# Patient Record
Sex: Male | Born: 1995 | Race: White | Hispanic: No | Marital: Married | Smoking: Never smoker
Health system: Southern US, Community
[De-identification: ages and names within clinical notes are randomized; demographics above are authoritative.]

## PROBLEM LIST (undated history)

## (undated) DIAGNOSIS — Z789 Other specified health status: Secondary | ICD-10-CM

## (undated) DIAGNOSIS — F32A Depression, unspecified: Secondary | ICD-10-CM

## (undated) DIAGNOSIS — F419 Anxiety disorder, unspecified: Secondary | ICD-10-CM

## (undated) DIAGNOSIS — F329 Major depressive disorder, single episode, unspecified: Secondary | ICD-10-CM

---
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
## 2017-12-17 ENCOUNTER — Emergency Department (HOSPITAL_COMMUNITY)
Admission: EM | Admit: 2017-12-17 | Discharge: 2017-12-18 | Disposition: A | Discharge status: Home / Self Care | Payer: Medicaid Other | Attending: Emergency Medicine | Admitting: Emergency Medicine

## 2017-12-17 DIAGNOSIS — K0889 Other specified disorders of teeth and supporting structures: Secondary | ICD-10-CM | POA: Diagnosis not present

## 2017-12-18 ENCOUNTER — Encounter (HOSPITAL_COMMUNITY): Payer: Self-pay

## 2017-12-18 MED ORDER — HYDROCODONE-ACETAMINOPHEN 5-325 MG PO TABS
2.0000 | ORAL_TABLET | Freq: Once | ORAL | Status: AC
  Administered 2017-12-18: 2 via ORAL
  Filled 2017-12-18: qty 2

## 2017-12-18 MED ORDER — PENICILLIN V POTASSIUM 250 MG PO TABS
500.0000 mg | ORAL_TABLET | Freq: Once | ORAL | Status: AC
  Administered 2017-12-18: 500 mg via ORAL
  Filled 2017-12-18: qty 2

## 2017-12-18 MED ORDER — PENICILLIN V POTASSIUM 500 MG PO TABS: 500.0000 mg | ORAL_TABLET | Freq: Four times a day (QID) | ORAL | 0 refills | Status: AC

## 2017-12-18 MED ORDER — NAPROXEN 500 MG PO TABS: 500.0000 mg | ORAL_TABLET | Freq: Two times a day (BID) | ORAL | 0 refills | Status: DC

## 2017-12-18 NOTE — Discharge Instructions (Signed)
Take antibiotics as directed. Please take all of your antibiotics until finished. ° °Take Naprosyn as directed.   ° °The exam and treatment you received today has been provided on an emergency basis only. This is not a substitute for complete medical or dental care. If your problem worsens or new symptoms (problems) appear, and you are unable to arrange prompt follow-up care with your dentist, call or return to this location. If you do not have a dentist, please follow-up with one on the list provided ° °CALL YOUR DENTIST OR RETURN IMMEDIATELY IF you develop a fever, rash, difficulty breathing or swallowing, neck or facial swelling, or other potentially serious concerns. ° ° °Please follow-up with one of the dental clinics provided to you below or in your paperwork. Call and tell them you were seen in the Emergency Dept and arrange for an appointment. You may have to call multiple places in order to find a place to be seen. ° °Dental Assistance °If the dentist on-call cannot see you, please use the resources below: ° ° °Patients with Medicaid: [facility] °[address], [phone] °[address], [phone] ° °If unable to pay, or uninsured, contact [facility] ([phone]) or [facility] ([phone] in [place], [phone] in [place]) to become qualified for the adult dental clinic ° °Other Low-Cost Community Dental Services: °[facility]- [address], [place], [place], [address] °   [phone], Ext. [phone] °   2nd and 4th Thursday of the month at 6:30am °   10 clients each day by appointment, can sometimes see walk-in     patients if someone does not show for an appointment °[facility]- [address], [place], [place], [address] °   [phone] °[facility]- [address], [place], [place], [address] °   [phone] ° °[facility]- [phone] °[facility]- [phone] °[facility]- [phone] ° °

## 2017-12-18 NOTE — ED Provider Notes (Signed)
[name] Provider Note   CSN: [id number] Arrival date & time: 12/17/17  2351     History   Chief Complaint Chief Complaint  Patient presents with  . Dental Pain    HPI Peter Pacheco is a 22 y.o. male who presents for evaluation of chronic dental pain that is been ongoing for last several months.  Patient reports that he has been seen by a dentist and states that he was told he needed to have a root canal.  Patient reports that since then he has had chronic pain to the right lower molar.  Patient states that he is not able to get an appointment until August 1 to get be fixed.  He has been seen by his dentist proximally 1 month ago and was given ibuprofen.  Patient reports that over the last few weeks, the pain has gotten more severe and more constant.  He also reports that the pain is started going up to his right ear.  He states that particularly pain is worse when eating.  He states he has been taking ibuprofen intermittently with minimal improvement in symptoms.  Patient states that he has been able to tolerate his secretions and p.o. without any difficulty.  Patient denies any fever, facial swelling, tongue or lip swelling, difficulty breathing, vomiting.  The history is provided by the patient.    History reviewed. No pertinent past medical history.  There are no active problems to display for this patient.   History reviewed. No pertinent surgical history.      Home Medications    Prior to Admission medications   Medication Sig Start Date End Date Taking? Authorizing Provider  amoxicillin (AMOXIL) 500 MG capsule Take 1 capsule (500 mg total) by mouth 3 (three) times daily. 08/04/15   Devoria AlbeKnapp, Iva, MD  naproxen (NAPROSYN) 500 MG tablet Take 1 tablet (500 mg total) by mouth 2 (two) times daily. 12/18/17   Graciella FreerLayden, Shawanna Zanders A, PA-C  penicillin v potassium (VEETID) 500 MG tablet Take 1 tablet (500 mg total) by mouth 4 (four) times daily for 7 days.  12/18/17 12/25/17  Graciella FreerLayden, Shian Goodnow A, PA-C  Pseudoephedrine-APAP-DM (DAYQUIL PO) Take by mouth.    [provider]    Family History No family history on file.  Social History Social History   Tobacco Use  . Smoking status: Never Smoker  . Smokeless tobacco: Never Used  Substance Use Topics  . Alcohol use: No  . Drug use: No     Allergies   Patient has no known allergies.   Review of Systems Review of Systems  Constitutional: Negative for fever.  HENT: Positive for dental problem and ear pain. Negative for drooling, facial swelling and trouble swallowing.   Respiratory: Negative for shortness of breath.   Gastrointestinal: Negative for vomiting.     Physical Exam Updated Vital Signs BP 137/73 (BP Location: Left Arm)   Pulse 60   Temp 98 F (36.7 C) (Oral)   Resp 15   Ht 5\' 11"  (1.803 m)   Wt 90.7 kg (200 lb)   SpO2 99%   BMI 27.89 kg/m   Physical Exam  Constitutional: He appears well-developed and well-nourished.  HENT:  Head: Normocephalic and atraumatic.  Mouth/Throat:    Airways patent, phonation is normal.  No evidence of oral angioedema.  Face is symmetric in appearance without any overlying warmth, erythema, edema.  Eyes: Conjunctivae and EOM are normal. Right eye exhibits no discharge. Left eye exhibits no discharge.  No scleral icterus.  Pulmonary/Chest: Effort normal.  Neurological: He is alert.  Skin: Skin is warm and dry.  Psychiatric: He has a normal mood and affect. His speech is normal and behavior is normal.  Nursing note and vitals reviewed.    ED Treatments / Results  Labs (all labs ordered are listed, but only abnormal results are displayed) Labs Reviewed - No data to display  EKG None  Radiology No results found.  Procedures Procedures (including critical care time)  Medications Ordered in ED Medications  HYDROcodone-acetaminophen (NORCO/VICODIN) 5-325 MG per tablet 2 tablet (has no administration in time range)    penicillin v potassium (VEETID) tablet 500 mg (has no administration in time range)     Initial Impression / Assessment and Plan / ED Course  I have reviewed the triage vital signs and the nursing notes.  Pertinent labs & imaging results that were available during my care of the patient were reviewed by me and considered in my medical decision making (see chart for details).     22 y.o. M presents with months of dental pain.  States seen by dentist and told he was needed to have a root canal.  Reports pain has become more constant svere and it started radiating up to his ear. Patient is afebrile, non-toxic appearing, sitting comfortably on examination table. Vital signs reviewed and stable.  No evidence of abscess requiring immediate incision and drainage. Exam not concerning for Ludwig's angina or pharyngeal abscess. Will treat with penicillin as patient with no known drug allergy. Patient instructed to follow-up with dentist referral provided. Stable for discharge at this time. Strict return precautions discussed. Patient expresses understanding and agreement to plan.  Final Clinical Impressions(s) / ED Diagnoses   Final diagnoses:  Pain, dental    ED Discharge Orders        Ordered    naproxen (NAPROSYN) 500 MG tablet  2 times daily     12/18/17 0025    penicillin v potassium (VEETID) 500 MG tablet  4 times daily     12/18/17 0025       Maxwell Caul, PA-C 12/18/17 0035    Gilda Crease, MD 12/18/17 [phone]

## 2017-12-18 NOTE — ED Triage Notes (Signed)
Pt states recently told he needed a root canal on a left lower tooth, states pain worsened recently, taking ibuprofen without relief.

## 2018-01-11 ENCOUNTER — Encounter (HOSPITAL_COMMUNITY): Payer: Self-pay

## 2018-01-11 ENCOUNTER — Emergency Department (HOSPITAL_COMMUNITY)
Admission: EM | Admit: 2018-01-11 | Discharge: 2018-01-11 | Disposition: A | Discharge status: Home / Self Care | Payer: Medicaid Other | Attending: Emergency Medicine | Admitting: Emergency Medicine

## 2018-01-11 DIAGNOSIS — K0889 Other specified disorders of teeth and supporting structures: Secondary | ICD-10-CM

## 2018-01-11 DIAGNOSIS — Z79899 Other long term (current) drug therapy: Secondary | ICD-10-CM | POA: Insufficient documentation

## 2018-01-11 DIAGNOSIS — K029 Dental caries, unspecified: Secondary | ICD-10-CM | POA: Diagnosis not present

## 2018-01-11 MED ORDER — ONDANSETRON HCL 4 MG PO TABS
4.0000 mg | ORAL_TABLET | Freq: Once | ORAL | Status: AC
  Administered 2018-01-11: 4 mg via ORAL
  Filled 2018-01-11: qty 1

## 2018-01-11 MED ORDER — KETOROLAC TROMETHAMINE 10 MG PO TABS
10.0000 mg | ORAL_TABLET | Freq: Once | ORAL | Status: AC
  Administered 2018-01-11: 10 mg via ORAL
  Filled 2018-01-11: qty 1

## 2018-01-11 MED ORDER — TRAMADOL HCL 50 MG PO TABS: ORAL_TABLET | ORAL | 0 refills | Status: DC

## 2018-01-11 MED ORDER — DICLOFENAC SODIUM 75 MG PO TBEC: 75.0000 mg | DELAYED_RELEASE_TABLET | Freq: Two times a day (BID) | ORAL | 0 refills | Status: DC

## 2018-01-11 MED ORDER — PENICILLIN V POTASSIUM 500 MG PO TABS: 500.0000 mg | ORAL_TABLET | Freq: Four times a day (QID) | ORAL | 0 refills | Status: AC

## 2018-01-11 MED ORDER — AMOXICILLIN 250 MG PO CAPS
500.0000 mg | ORAL_CAPSULE | Freq: Once | ORAL | Status: AC
  Administered 2018-01-11: 500 mg via ORAL
  Filled 2018-01-11: qty 2

## 2018-01-11 MED ORDER — TRAMADOL HCL 50 MG PO TABS
100.0000 mg | ORAL_TABLET | Freq: Once | ORAL | Status: AC
  Administered 2018-01-11: 100 mg via ORAL
  Filled 2018-01-11: qty 2

## 2018-01-11 NOTE — Discharge Instructions (Addendum)
Your vital signs are within normal limits.  Your examination reveals an infected tooth on the left side.  There is no evidence that the infection has left the dental area.  Please use penicillin for times daily, please use diclofenac 2 times daily with a meal.  May use Ultram every 6 hours if needed for more severe pain. This medication may cause drowsiness. Please do not drink, drive, or participate in activity that requires concentration while taking this medication.  Please see Dr. Conni ElliotLaw for any additional pain management.  Please see your dentist as soon as possible concerning your increasing pain.

## 2018-01-11 NOTE — ED Provider Notes (Signed)
[facility]   CSN: [id number] Arrival date & time: 01/11/18  1826     History   Chief Complaint Chief Complaint  Patient presents with  . Dental Pain    HPI Peter Pacheco is a 22 y.o. male.  Patient is a 22 year old male who presents to the emergency department with a complaint of left lower toothache.  The patient states he has been having problems with this tooth on and on for a few months now.  He has been seen by his dentist, and has an appointment for the root canal on August 24 of 2019.  He had been prescribed penicillin and naproxen.  He says that this is no longer helping his pain at all.  His pain is now beginning to interfere with his work and with his rest at home.  He has not had any fever or chills.  He has not had any unusual swelling of his face or his mouth.  He is able to swallow without problem and he has no interference with his breathing.  The history is provided by the patient.  Dental Pain      History reviewed. No pertinent past medical history.  There are no active problems to display for this patient.   History reviewed. No pertinent surgical history.      Home Medications    Prior to Admission medications   Medication Sig Start Date End Date Taking? Authorizing Provider  amoxicillin (AMOXIL) 500 MG capsule Take 1 capsule (500 mg total) by mouth 3 (three) times daily. 08/04/15   Devoria AlbeKnapp, Iva, MD  naproxen (NAPROSYN) 500 MG tablet Take 1 tablet (500 mg total) by mouth 2 (two) times daily. 12/18/17   Graciella FreerLayden, Lindsey A, PA-C  Pseudoephedrine-APAP-DM (DAYQUIL PO) Take by mouth.    [provider]    Family History No family history on file.  Social History Social History   Tobacco Use  . Smoking status: Never Smoker  . Smokeless tobacco: Never Used  Substance Use Topics  . Alcohol use: No  . Drug use: No     Allergies   Patient has no known allergies.   Review of Systems Review of  Systems  Constitutional: Negative for activity change.       All ROS Neg except as noted in HPI  HENT: Positive for dental problem. Negative for nosebleeds.   Eyes: Negative for photophobia and discharge.  Respiratory: Negative for cough, shortness of breath and wheezing.   Cardiovascular: Negative for chest pain and palpitations.  Gastrointestinal: Negative for abdominal pain and blood in stool.  Genitourinary: Negative for dysuria, frequency and hematuria.  Musculoskeletal: Negative for arthralgias, back pain and neck pain.  Skin: Negative.   Neurological: Negative for dizziness, seizures and speech difficulty.  Psychiatric/Behavioral: Negative for confusion and hallucinations.     Physical Exam Updated Vital Signs BP 117/63 (BP Location: Right Arm)   Pulse 66   Temp 98.2 F (36.8 C) (Oral)   Resp 16   Ht 5\' 11"  (1.803 m)   Wt 90.7 kg   SpO2 99%   BMI 27.89 kg/m   Physical Exam  Constitutional: He is oriented to person, place, and time. He appears well-developed and well-nourished.  Non-toxic appearance.  HENT:  Head: Normocephalic.  Right Ear: Tympanic membrane and external ear normal.  Left Ear: Tympanic membrane and external ear normal.  Mouth/Throat: Uvula is midline.    There is a deep cavity of the left lower first molar.  There is swelling of the gum in this area.  No visible abscess.  The airway is patent.  The uvula is in the midline.  There is no swelling under the tongue.  There is no facial asymmetry.  There is no temperature changes about the face.  Eyes: Pupils are equal, round, and reactive to light. EOM and lids are normal.  Neck: Normal range of motion. Neck supple. Carotid bruit is not present.  Cardiovascular: Normal rate, regular rhythm, S1 normal, S2 normal, normal heart sounds, intact distal pulses and normal pulses.  No murmur heard. Pulmonary/Chest: Breath sounds normal. No respiratory distress.  Abdominal: Soft. Bowel sounds are normal. There  is no tenderness. There is no guarding.  Musculoskeletal: Normal range of motion.  Lymphadenopathy:       Head (right side): No submandibular adenopathy present.       Head (left side): No submandibular adenopathy present.    He has no cervical adenopathy.  Neurological: He is alert and oriented to person, place, and time. He has normal strength. No cranial nerve deficit or sensory deficit.  Skin: Skin is warm and dry.  Psychiatric: He has a normal mood and affect. His speech is normal.  Nursing note and vitals reviewed.    ED Treatments / Results  Labs (all labs ordered are listed, but only abnormal results are displayed) Labs Reviewed - No data to display  EKG None  Radiology No results found.  Procedures Procedures (including critical care time)  Medications Ordered in ED Medications  amoxicillin (AMOXIL) capsule 500 mg (has no administration in time range)  ketorolac (TORADOL) tablet 10 mg (has no administration in time range)  traMADol (ULTRAM) tablet 100 mg (has no administration in time range)  ondansetron (ZOFRAN) tablet 4 mg (has no administration in time range)     Initial Impression / Assessment and Plan / ED Course  I have reviewed the triage vital signs and the nursing notes.  Pertinent labs & imaging results that were available during my care of the patient were reviewed by me and considered in my medical decision making (see chart for details).       Final Clinical Impressions(s) / ED Diagnoses MDM  Vital signs within normal limits.  Pulse oximetry is 99% on room air.  Within normal limits by my interpretation.  Patient has a dental carry on the left lower molar area.  The current medication is no longer controlling his pain.  There is no evidence for Ludewig's angina or other emergent changes.  The patient will be prescribed antibiotics.  We will change his anti-inflammatory medication to assist with his pain.  The patient will be given 10 tablets of  Ultram to use for more severe pain.  The patient is advised to see Dr. Conni Elliot if any additional pain assistance is needed.  Patient is also encouraged to inquire if his dentist can see him any earlier since he is having pain that is interfering with his work as well as with his activities of daily living.   Final diagnoses:  Dental caries  Pain, dental    ED Discharge Orders         Ordered    penicillin v potassium (VEETID) 500 MG tablet  4 times daily     01/11/18 1922    diclofenac (VOLTAREN) 75 MG EC tablet  2 times daily     01/11/18 1923    traMADol (ULTRAM) 50 MG tablet     01/11/18 1923  Ivery Quale, PA-C 01/11/18 1934    Donnetta Hutching, MD 01/14/18 [phone]

## 2018-01-11 NOTE — ED Notes (Signed)
Pt seen here several weeks ago for dental pain  Reports was given antibiotics and has an appt for root canal the 20 something of this month  Here for pain

## 2018-01-11 NOTE — ED Triage Notes (Signed)
Pt seen 12/17/17 and has appt on 01/24/18 for root canal. Pt reports that he took meds and tooth was better, Now left lower tooth has been hurting for 3 days

## 2018-04-06 DIAGNOSIS — F4320 Adjustment disorder, unspecified: Secondary | ICD-10-CM | POA: Diagnosis not present

## 2018-06-10 DIAGNOSIS — G4709 Other insomnia: Secondary | ICD-10-CM | POA: Diagnosis not present

## 2019-04-23 ENCOUNTER — Encounter (HOSPITAL_COMMUNITY): Payer: Self-pay

## 2019-04-23 ENCOUNTER — Other Ambulatory Visit: Payer: Self-pay

## 2019-04-23 ENCOUNTER — Emergency Department (HOSPITAL_COMMUNITY)
Admission: EM | Admit: 2019-04-23 | Discharge: 2019-04-24 | Disposition: A | Discharge status: Other Acute Inpatient Hospital | Payer: Medicaid Other | Attending: Emergency Medicine | Admitting: Emergency Medicine

## 2019-04-23 DIAGNOSIS — R45851 Suicidal ideations: Secondary | ICD-10-CM | POA: Insufficient documentation

## 2019-04-23 DIAGNOSIS — Z03818 Encounter for observation for suspected exposure to other biological agents ruled out: Secondary | ICD-10-CM | POA: Diagnosis not present

## 2019-04-23 DIAGNOSIS — F332 Major depressive disorder, recurrent severe without psychotic features: Secondary | ICD-10-CM | POA: Diagnosis not present

## 2019-04-23 DIAGNOSIS — F329 Major depressive disorder, single episode, unspecified: Secondary | ICD-10-CM | POA: Diagnosis not present

## 2019-04-23 DIAGNOSIS — T424X2A Poisoning by benzodiazepines, intentional self-harm, initial encounter: Secondary | ICD-10-CM | POA: Diagnosis not present

## 2019-04-23 DIAGNOSIS — R001 Bradycardia, unspecified: Secondary | ICD-10-CM | POA: Diagnosis not present

## 2019-04-23 DIAGNOSIS — Z046 Encounter for general psychiatric examination, requested by authority: Secondary | ICD-10-CM | POA: Insufficient documentation

## 2019-04-23 DIAGNOSIS — Z20828 Contact with and (suspected) exposure to other viral communicable diseases: Secondary | ICD-10-CM | POA: Diagnosis not present

## 2019-04-23 DIAGNOSIS — Z79899 Other long term (current) drug therapy: Secondary | ICD-10-CM | POA: Insufficient documentation

## 2019-04-23 DIAGNOSIS — F32A Depression, unspecified: Secondary | ICD-10-CM

## 2019-04-23 LAB — COMPREHENSIVE METABOLIC PANEL
ALT: 25 U/L (ref 0–44)
AST: 23 U/L (ref 15–41)
Albumin: 5 g/dL (ref 3.5–5.0)
Alkaline Phosphatase: 82 U/L (ref 38–126)
Anion gap: 10 (ref 5–15)
BUN: 11 mg/dL (ref 6–20)
CO2: 26 mmol/L (ref 22–32)
Calcium: 9.7 mg/dL (ref 8.9–10.3)
Chloride: 102 mmol/L (ref 98–111)
Creatinine, Ser: 0.99 mg/dL (ref 0.61–1.24)
GFR calc Af Amer: >60 mL/min (ref >60)
GFR calc non Af Amer: >60 mL/min (ref >60)
Glucose, Bld: 92 mg/dL (ref 70–99)
Potassium: 4 mmol/L (ref 3.5–5.1)
Sodium: 138 mmol/L (ref 135–145)
Total Bilirubin: 0.9 mg/dL (ref 0.3–1.2)
Total Protein: 8 g/dL (ref 6.5–8.1)

## 2019-04-23 LAB — RAPID URINE DRUG SCREEN, HOSP PERFORMED
Amphetamines: NOT DETECTED
Barbiturates: NOT DETECTED
Benzodiazepines: POSITIVE — AB
Cocaine: NOT DETECTED
Opiates: NOT DETECTED
Tetrahydrocannabinol: POSITIVE — AB

## 2019-04-23 LAB — CBC
HCT: 47.7 % (ref 39.0–52.0)
Hemoglobin: 15.9 g/dL (ref 13.0–17.0)
MCH: 29.1 pg (ref 26.0–34.0)
MCHC: 33.3 g/dL (ref 30.0–36.0)
MCV: 87.2 fL (ref 80.0–100.0)
Platelets: 201 10*3/uL (ref 150–400)
RBC: 5.47 MIL/uL (ref 4.22–5.81)
RDW: 12.6 % (ref 11.5–15.5)
WBC: 6.4 10*3/uL (ref 4.0–10.5)
nRBC: 0 % (ref 0.0–0.2)

## 2019-04-23 LAB — SARS CORONAVIRUS 2 BY RT PCR (HOSPITAL ORDER, PERFORMED IN ~~LOC~~ HOSPITAL LAB): SARS Coronavirus 2: NEGATIVE

## 2019-04-23 LAB — ETHANOL: Alcohol, Ethyl (B): <10 mg/dL (ref <10)

## 2019-04-23 LAB — ACETAMINOPHEN LEVEL: Acetaminophen (Tylenol), Serum: <10 ug/mL — ABNORMAL LOW (ref 10–30)

## 2019-04-23 LAB — SALICYLATE LEVEL: Salicylate Lvl: <7 mg/dL (ref 2.8–30.0)

## 2019-04-23 NOTE — ED Triage Notes (Signed)
Pt reports SI that has been ongoing for the past 5 years. Pt reports being in an argument with fiance' and she told him "to kill himself", pt reports taking 4 "blue xanax bars" about an hour before arrival, his fiance' called the law enforcement and he was brought here voluntarily by [facility]. Pt is alert and oriented in triage, but has slow speech and unsteady gait.

## 2019-04-23 NOTE — ED Notes (Signed)
Poison control updated with lab values.

## 2019-04-23 NOTE — ED Provider Notes (Signed)
[facility]   CSN: [id number] Arrival date & time: 04/23/19  1827     History   Chief Complaint Chief Complaint  Patient presents with  . Suicidal    HPI Peter Pacheco is a 23 y.o. male with no significant past medical history who presents today for evaluation after suicide attempt.  He reports that every day he has had suicidal thoughts over the past 4 to 5 years.  He was in an argument with his fiance today and she reportedly told him "to kill himself."  He went and talk for "Xanax bars" about 8 mg.  This reportedly happened about an hour prior to arrival.  His fiance called law enforcement who brought him here.  He states to me that he still is having thoughts about wanting to harm himself and says "I just did not have enough pills this time."  He states that he has previously cut himself and has previously put a loaded gun in his mouth however "I just keep chickening out."    He reports that he does not see a counselor or a psychiatrist.  He denies homicidal ideations however does note that he is currently on probation for fighting.  He denies any physical complaints or concerns today.  He states that he got the Xanax bars that were "pressed" and not pharmacy grade.  He reports that he also uses Adderall, occasionally uses cocaine.  He denies any opioid abuse.    When I discuss admission with patient he states that "it is not a good time for me to be admitted, I have other things to do."  HPI  History reviewed. No pertinent past medical history.  There are no active problems to display for this patient.   History reviewed. No pertinent surgical history.      Home Medications    Prior to Admission medications   Medication Sig Start Date End Date Taking? Authorizing Provider  amoxicillin (AMOXIL) 500 MG capsule Take 1 capsule (500 mg total) by mouth 3 (three) times daily. 08/04/15   Devoria AlbeKnapp, Iva, MD  diclofenac (VOLTAREN) 75 MG EC  tablet Take 1 tablet (75 mg total) by mouth 2 (two) times daily. 01/11/18   Ivery QualeBryant, Hobson, PA-C  naproxen (NAPROSYN) 500 MG tablet Take 1 tablet (500 mg total) by mouth 2 (two) times daily. 12/18/17   Graciella FreerLayden, Lindsey A, PA-C  Pseudoephedrine-APAP-DM (DAYQUIL PO) Take by mouth.    [provider]  traMADol Janean Sark(ULTRAM) 50 MG tablet 1 or 2 po q6h prn pain 01/11/18   Ivery QualeBryant, Hobson, PA-C    Family History No family history on file.  Social History Social History   Tobacco Use  . Smoking status: Never Smoker  . Smokeless tobacco: Never Used  Substance Use Topics  . Alcohol use: Yes    Comment: every other weekend  . Drug use: Yes    Types: Marijuana     Allergies   Patient has no known allergies.   Review of Systems Review of Systems  Constitutional: Negative for chills and fever.  Respiratory: Negative for cough and chest tightness.   Gastrointestinal: Negative for abdominal pain.  Genitourinary: Negative for dysuria.  Musculoskeletal: Negative for back pain and neck pain.  Skin: Negative for color change and wound.  Neurological: Negative for headaches.  Psychiatric/Behavioral: Positive for behavioral problems, dysphoric mood, self-injury and suicidal ideas.  All other systems reviewed and are negative.    Physical Exam Updated Vital Signs BP 107/71 (BP  Location: Right Arm)   Pulse 76   Temp 98.5 F (36.9 C) (Oral)   Resp 16   Ht 5\' 11"  (1.803 m)   Wt 102.1 kg   SpO2 100%   BMI 31.38 kg/m   Physical Exam Vitals signs and nursing note reviewed.  Constitutional:      General: He is not in acute distress.    Appearance: He is well-developed. He is not diaphoretic.     Comments: Slightly drowsy, however is easily aroused  HENT:     Head: Normocephalic and atraumatic.  Eyes:     General: No scleral icterus.       Right eye: No discharge.        Left eye: No discharge.     Conjunctiva/sclera: Conjunctivae normal.  Neck:     Musculoskeletal: Normal range  of motion.  Cardiovascular:     Rate and Rhythm: Normal rate and regular rhythm.  Pulmonary:     Effort: Pulmonary effort is normal. No respiratory distress.     Breath sounds: No stridor.  Abdominal:     General: There is no distension.  Musculoskeletal:        General: No deformity.  Skin:    General: Skin is warm and dry.  Neurological:     Mental Status: He is oriented to person, place, and time and easily aroused.     Motor: No abnormal muscle tone.  Psychiatric:        Attention and Perception: He is inattentive.        Mood and Affect: Affect is blunt.        Speech: Speech normal.        Thought Content: Thought content includes suicidal ideation. Thought content does not include homicidal ideation. Thought content does not include homicidal plan.     Comments: He reportedly took 8 mg of Xanax in a suicide attempt today.  He continues to express suicidal ideation.  When asked about a plan he refuses to answer as "I want to end up in a mental facility."      ED Treatments / Results  Labs (all labs ordered are listed, but only abnormal results are displayed) Labs Reviewed  ACETAMINOPHEN LEVEL - Abnormal; Notable for the following components:      Result Value   Acetaminophen (Tylenol), Serum <10 (*)    All other components within normal limits  RAPID URINE DRUG SCREEN, HOSP PERFORMED - Abnormal; Notable for the following components:   Benzodiazepines POSITIVE (*)    Tetrahydrocannabinol POSITIVE (*)    All other components within normal limits  SARS CORONAVIRUS 2 BY RT PCR (HOSPITAL ORDER, PERFORMED IN [facility])  COMPREHENSIVE METABOLIC PANEL  ETHANOL  SALICYLATE LEVEL  CBC    EKG None  Radiology No results found.  Procedures Procedures (including critical care time)  Medications Ordered in ED Medications - No data to display   Initial Impression / Assessment and Plan / ED Course  I have reviewed the triage vital signs and the nursing  notes.  Pertinent labs & imaging results that were available during my care of the patient were reviewed by me and considered in my medical decision making (see chart for details).  Clinical Course as of Apr 22 2133  12-02-1968 Apr 23, 2019  2042 Spoke with [facility] who recommends inpatient treatment, and that is patient is unwilling to do this they would recommend IVC given that he still expresses suicidal ideation.   [EH]  Clinical Course User Index [EH] Lorin Glass, PA-C      Patient presents today for evaluation after a suicide attempt.  He took 4 "Xanax bars" presumed to be 2 mg each in an attempt to kill himself after a fight with his fiance.  He denies any other coingestions today.  Poison control recommended observation for 6 hours with a 4-hour Tylenol level.    Patient continued to endorse suicidal ideations.  He has previously cut his wrists and put a loaded gun in his mouth.  He denies any physical complaints or concerns today.  He states that he uses marijuana, occasionally uses Adderall and cocaine.  TTS evaluated patient who recommended inpatient placement.  He has a bed [facility] pending medical clearance.  IVC papers were completed as patient was unwilling to go voluntarily in the setting of a reported suicide attempt.  At shift change care was transferred to [facility] who will follow pending studies, re-evaulate and determine disposition.    At this time barriers to medical clearance include obtaining 4 hour tylenol, Covid test, and 6 hours of observation.    Final Clinical Impressions(s) / ED Diagnoses   Final diagnoses:  Suicide attempt by benzodiazepine overdose [facility])  Depression, unspecified depression type    ED Discharge Orders    None       Ollen Gross 04/23/19 2242    Daleen Bo, MD 04/26/19 [phone]

## 2019-04-23 NOTE — ED Notes (Signed)
Pt wanded by security in triage. 

## 2019-04-23 NOTE — ED Provider Notes (Signed)
   2210 patient signed out to me by Wyn Quaker, PA-C at end of shift.  Patient is a 23 year old male who presented for suicidal attempt by overdosing with Xanax after an argument with his fiance.  Patient is currently pending medical clearance awaiting 4-hour Tylenol level and Covid test.  Poison control was contacted.  He has been IVC and TTS consult has been completed with recommendations for inpatient care at [facility].  Patient remains calm, vitals reviewed.  4-hour Tylenol level is unchanged, Covid test negative.  From a medical standpoint, he has been observed for 6 hours without complication.   He appears medically cleared for inpatient placement at [facility].   Kem Parkinson, PA-C 04/24/19 0059    Daleen Bo, MD 04/26/19 [phone]
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
## 2019-04-23 NOTE — ED Notes (Signed)
Mother has been updated and made aware that the patient will be transferred to [facility].

## 2019-04-24 ENCOUNTER — Inpatient Hospital Stay (HOSPITAL_COMMUNITY)
Admission: AD | Admit: 2019-04-24 | Discharge: 2019-04-27 | DRG: 885 | Disposition: A | Discharge status: Home / Self Care | Payer: Medicaid Other | Source: Intra-hospital | Attending: Psychiatry | Admitting: Psychiatry

## 2019-04-24 ENCOUNTER — Encounter (HOSPITAL_COMMUNITY): Payer: Self-pay

## 2019-04-24 ENCOUNTER — Other Ambulatory Visit: Payer: Self-pay

## 2019-04-24 DIAGNOSIS — F1590 Other stimulant use, unspecified, uncomplicated: Secondary | ICD-10-CM | POA: Diagnosis present

## 2019-04-24 DIAGNOSIS — Z7289 Other problems related to lifestyle: Secondary | ICD-10-CM | POA: Diagnosis not present

## 2019-04-24 DIAGNOSIS — F1994 Other psychoactive substance use, unspecified with psychoactive substance-induced mood disorder: Secondary | ICD-10-CM | POA: Diagnosis not present

## 2019-04-24 DIAGNOSIS — F1220 Cannabis dependence, uncomplicated: Secondary | ICD-10-CM

## 2019-04-24 DIAGNOSIS — R9431 Abnormal electrocardiogram [ECG] [EKG]: Secondary | ICD-10-CM

## 2019-04-24 DIAGNOSIS — T424X2A Poisoning by benzodiazepines, intentional self-harm, initial encounter: Secondary | ICD-10-CM | POA: Diagnosis present

## 2019-04-24 DIAGNOSIS — Z20828 Contact with and (suspected) exposure to other viral communicable diseases: Secondary | ICD-10-CM | POA: Diagnosis present

## 2019-04-24 DIAGNOSIS — F1290 Cannabis use, unspecified, uncomplicated: Secondary | ICD-10-CM | POA: Diagnosis present

## 2019-04-24 DIAGNOSIS — Z811 Family history of alcohol abuse and dependence: Secondary | ICD-10-CM

## 2019-04-24 DIAGNOSIS — F419 Anxiety disorder, unspecified: Secondary | ICD-10-CM | POA: Diagnosis present

## 2019-04-24 DIAGNOSIS — T1491XA Suicide attempt, initial encounter: Secondary | ICD-10-CM | POA: Diagnosis present

## 2019-04-24 DIAGNOSIS — F19939 Other psychoactive substance use, unspecified with withdrawal, unspecified: Secondary | ICD-10-CM | POA: Diagnosis present

## 2019-04-24 DIAGNOSIS — I456 Pre-excitation syndrome: Secondary | ICD-10-CM | POA: Diagnosis present

## 2019-04-24 DIAGNOSIS — G4700 Insomnia, unspecified: Secondary | ICD-10-CM | POA: Diagnosis present

## 2019-04-24 DIAGNOSIS — F332 Major depressive disorder, recurrent severe without psychotic features: Secondary | ICD-10-CM | POA: Diagnosis not present

## 2019-04-24 DIAGNOSIS — F1320 Sedative, hypnotic or anxiolytic dependence, uncomplicated: Secondary | ICD-10-CM | POA: Diagnosis not present

## 2019-04-24 DIAGNOSIS — R519 Headache, unspecified: Secondary | ICD-10-CM | POA: Diagnosis present

## 2019-04-24 HISTORY — DX: Other specified health status: Z78.9

## 2019-04-24 HISTORY — DX: Depression, unspecified: F32.A

## 2019-04-24 HISTORY — DX: Anxiety disorder, unspecified: F41.9

## 2019-04-24 LAB — ACETAMINOPHEN LEVEL: Acetaminophen (Tylenol), Serum: <10 ug/mL — ABNORMAL LOW (ref 10–30)

## 2019-04-24 MED ORDER — ZIPRASIDONE MESYLATE 20 MG IM SOLR: 20.0000 mg | INTRAMUSCULAR | Status: DC | PRN

## 2019-04-24 MED ORDER — ALUM & MAG HYDROXIDE-SIMETH 200-200-20 MG/5ML PO SUSP: 30.0000 mL | ORAL | Status: DC | PRN

## 2019-04-24 MED ORDER — OLANZAPINE 10 MG PO TBDP: 10.0000 mg | ORAL_TABLET | Freq: Three times a day (TID) | ORAL | Status: DC | PRN

## 2019-04-24 MED ORDER — ZIPRASIDONE MESYLATE 20 MG IM SOLR
INTRAMUSCULAR | Status: AC
  Administered 2019-04-24: 20 mg via INTRAMUSCULAR
  Filled 2019-04-24: qty 20

## 2019-04-24 MED ORDER — TRAZODONE HCL 50 MG PO TABS
50.0000 mg | ORAL_TABLET | Freq: Every evening | ORAL | Status: DC | PRN
  Administered 2019-04-24: 50 mg via ORAL
  Filled 2019-04-24 (×2): qty 1

## 2019-04-24 MED ORDER — SERTRALINE HCL 25 MG PO TABS
25.0000 mg | ORAL_TABLET | Freq: Every day | ORAL | Status: DC
  Administered 2019-04-24 – 2019-04-27 (×4): 25 mg via ORAL
  Filled 2019-04-24 (×6): qty 1

## 2019-04-24 MED ORDER — LORAZEPAM 1 MG PO TABS
1.0000 mg | ORAL_TABLET | ORAL | Status: DC | PRN
  Filled 2019-04-24: qty 1

## 2019-04-24 MED ORDER — LORAZEPAM 1 MG PO TABS
1.0000 mg | ORAL_TABLET | Freq: Four times a day (QID) | ORAL | Status: DC | PRN
  Administered 2019-04-25 (×2): 1 mg via ORAL
  Filled 2019-04-24: qty 1

## 2019-04-24 MED ORDER — ACETAMINOPHEN 325 MG PO TABS
650.0000 mg | ORAL_TABLET | Freq: Four times a day (QID) | ORAL | Status: DC | PRN
  Administered 2019-04-25: 650 mg via ORAL
  Filled 2019-04-24: qty 2

## 2019-04-24 MED ORDER — ZIPRASIDONE MESYLATE 20 MG IM SOLR
20.0000 mg | Freq: Once | INTRAMUSCULAR | Status: AC
  Administered 2019-04-24: 01:00:00 20 mg via INTRAMUSCULAR

## 2019-04-24 MED ORDER — STERILE WATER FOR INJECTION IJ SOLN
INTRAMUSCULAR | Status: AC
  Administered 2019-04-24: 1.2 mL
  Filled 2019-04-24: qty 10

## 2019-04-24 MED ORDER — HYDROXYZINE HCL 25 MG PO TABS
25.0000 mg | ORAL_TABLET | Freq: Three times a day (TID) | ORAL | Status: DC | PRN
  Administered 2019-04-24: 22:00:00 25 mg via ORAL
  Filled 2019-04-24: qty 1

## 2019-04-24 MED ORDER — MAGNESIUM HYDROXIDE 400 MG/5ML PO SUSP: 30.0000 mL | Freq: Every day | ORAL | Status: DC | PRN

## 2019-04-24 NOTE — BHH Group Notes (Signed)
[place] Group Notes: (Clinical Social Work)   04/24/2019      Type of Therapy:  Group Therapy   Participation Level:  Did Not Attend - was invited both individually by MHT and by overhead announcement, chose not to attend.   Selmer Dominion, LCSW 04/24/2019, 11:11 AM

## 2019-04-24 NOTE — Progress Notes (Signed)
[facility] Group Notes:  (Nursing/MHT/Case Management/Adjunct)  Date:  04/24/2019  Time:  2030  Type of Therapy:  wrap up group  Participation Level:  Active  Participation Quality:  Appropriate, Attentive, Sharing and Supportive  Affect:  Appropriate  Cognitive:  Appropriate  Insight:  Improving  Engagement in Group:  Engaged  Modes of Intervention:  Clarification, Education and Support  Summary of Progress/Problems:  Peter Pacheco 04/24/2019, 9:35 PM

## 2019-04-24 NOTE — ED Notes (Addendum)
Pt currently upset about not being able to go home at this time. Explained to pt that he is now IVC'd and pt still stating that he is not going to stay no matter what. Pt asked to go to restroom. Pt allowed to walk to bathroom with security. On the way back to patient room pt attempted to run towards the exit. Pt escorted back to treatment room by security and this RN. Pt continue to yell and cuss demanding to leave. Pt given IM geodon per MD orders. Hospital security, [employer], and [employer] at bedside at this time.

## 2019-04-24 NOTE — Progress Notes (Signed)
Patient ID: BING DUFFEY, male   DOB: 1995-12-05, 23 y.o.   MRN: [id number]  Cadarius is a 23 year old involuntary male from [facility]. Reports fiance called the police after they had an argument and he ingested 8mg  of xanax impulsively. He reported to TTS that she told him to kill himself. He reported increased depression and SI. He reports that he has cut himself and put a gun in his mouth in the past but never sought treatment. He has never been inpatient in the past and didn't want to come here. He tried to leave [employer] and security stopped him. He was given Geodon 20mg  IM at approximately 6:38GY and then the police picked up for the IVC and got here at 4:30am. He was very drowsy due to the medication but was able to answer some questions and sign some papers. Did make a call to his "fiance" telling her he loved her. Has a hx of drug use including marijuana, LSD, benzos,adderall, and cocaine. He was positive for THC and benzos. He is on probation for manufacturing marijuana, possession of marijuana, and possession of a firearm. He reported to TTS that he has a court ordered drug assessment at [facility] in December. No diagnosed medical issues at present. Drowsy but cooperative at this time.
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
## 2019-04-24 NOTE — Progress Notes (Signed)
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
## 2019-04-24 NOTE — Progress Notes (Signed)
EKG results placed on the outside of shadow chart  Normal sinus rhythm  *Wolff-Parkinson-White  Abnormal ECG  QT/QTc 404/486 ms

## 2019-04-24 NOTE — ED Provider Notes (Signed)
   Date: 04/23/19- 20:34  Rate: 71  Rhythm: normal sinus rhythm  QRS Axis: normal  PR and QT Intervals: normal  ST/T Wave abnormalities: normal  PR and QRS Conduction Disutrbances:WPW criteria  Narrative Interpretation:   Old EKG Reviewed: none available    Daleen Bo, MD 04/24/19 0144

## 2019-04-24 NOTE — Progress Notes (Signed)
D. Pt presents with a depressed affect/mood- remained in bed for much of the morning- Pt  currently denies SI/HI and AVH A. Labs and vitals monitored. Pt given and educated on medications. Pt supported emotionally and encouraged to express concerns and ask questions.   R. Pt remains safe with 15 minute checks. Will continue POC.

## 2019-04-25 MED ORDER — TRAZODONE HCL 50 MG PO TABS
50.0000 mg | ORAL_TABLET | Freq: Every evening | ORAL | Status: DC | PRN
  Filled 2019-04-25: qty 1

## 2019-04-25 MED ORDER — RISPERIDONE 1 MG PO TABS
1.0000 mg | ORAL_TABLET | Freq: Every day | ORAL | Status: DC
  Administered 2019-04-25: 1 mg via ORAL
  Filled 2019-04-25 (×2): qty 1

## 2019-04-25 MED ORDER — TRAZODONE HCL 50 MG PO TABS
50.0000 mg | ORAL_TABLET | Freq: Every day | ORAL | Status: DC
  Administered 2019-04-25: 50 mg via ORAL
  Filled 2019-04-25: qty 1

## 2019-04-25 NOTE — [facility] Group Notes (Signed)
Adult Psychoeducational Group Note  Date:  04/25/2019 Time:  9:52 PM  Group Topic/Focus:  Wrap-Up Group:   The focus of this group is to help patients review their daily goal of treatment and discuss progress on daily workbooks.  Participation Level:  Active  Participation Quality:  Appropriate  Affect:  Appropriate  Cognitive:  Appropriate  Insight: Appropriate  Engagement in Group:  Engaged  Modes of Intervention:  Discussion  Additional Comments:  Pt stated his goal was to have a better mindset with how he handles adversities in his life.  Pt stated his goal is on-going.  Pt rated the day at a 6/10.  Zak Gondek 04/25/2019, 9:52 PM

## 2019-04-25 NOTE — Progress Notes (Signed)
D.  Pt pleasant on approach, no complaints voiced at this time.  Pt was positive for evening wrap up group, observed appropriately engaged with peers on the unit.  Pt denies SI/HI/AVH at this time.  A.  Support and encouragement offered, medication given as ordered  R.  Pt remains safe on the unit, will continue to monitor.   

## 2019-04-25 NOTE — Progress Notes (Signed)
   04/25/19 1500  Psych Admission Type (Psych Patients Only)  Admission Status Involuntary  Psychosocial Assessment  Patient Complaints None  Eye Contact Brief  Facial Expression Flat  Affect Appropriate to circumstance  Speech Logical/coherent  Interaction Assertive  Motor Activity Other (Comment) (WDL)  Appearance/Hygiene Disheveled  Behavior Characteristics Cooperative;Appropriate to situation  Mood Depressed  Thought Process  Coherency WDL  Content WDL  Delusions None reported or observed  Perception WDL  Hallucination None reported or observed  Judgment Impaired  Confusion None  Danger to Self  Current suicidal ideation? Denies  Danger to Others  Danger to Others None reported or observed
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
## 2019-04-25 NOTE — Progress Notes (Signed)
[place] NOVEL CORONAVIRUS (COVID-19) DAILY CHECK-OFF SYMPTOMS - answer yes or no to each - every day NO YES  Have you had a fever in the past 24 hours?  . Fever (Temp > 37.80C / 100F) X   Have you had any of these symptoms in the past 24 hours? . New Cough .  Sore Throat  .  Shortness of Breath .  Difficulty Breathing .  Unexplained Body Aches   X   Have you had any one of these symptoms in the past 24 hours not related to allergies?   . Runny Nose .  Nasal Congestion .  Sneezing   X   If you have had runny nose, nasal congestion, sneezing in the past 24 hours, has it worsened?  X   EXPOSURES - check yes or no X   Have you traveled outside the state in the past 14 days?  X   Have you been in contact with someone with a confirmed diagnosis of COVID-19 or PUI in the past 14 days without wearing appropriate PPE?  X   Have you been living in the same home as a person with confirmed diagnosis of COVID-19 or a PUI (household contact)?    X   Have you been diagnosed with COVID-19?    X              What to do next: Answered NO to all: Answered YES to anything:   Proceed with unit schedule Follow the [facility] Inpatient Flowsheet.   
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
## 2019-04-26 MED ORDER — MIRTAZAPINE 7.5 MG PO TABS
7.5000 mg | ORAL_TABLET | Freq: Every day | ORAL | Status: DC
  Administered 2019-04-26: 22:00:00 7.5 mg via ORAL
  Filled 2019-04-26 (×4): qty 1

## 2019-04-26 NOTE — Plan of Care (Signed)
Nurse discussed anxiety, depression and coping skills with patient.  

## 2019-04-26 NOTE — Progress Notes (Signed)
D:  Patient denied SI and HI, contracts for safety.  Denied A/V hallucinations.   A:  Medications administered per MD orders.  Emotional support and encouragement given patient. R:  Safety maintained with 15 minute checks.  

## 2019-04-26 NOTE — Tx Team (Signed)
Interdisciplinary Treatment and Diagnostic Plan Update  04/26/2019 Time of Session: 9:00am Peter Pacheco MRN: [id number]  Principal Diagnosis: Severe recurrent major depression without psychotic features [facility])  Secondary Diagnoses: Principal Problem:   Severe recurrent major depression without psychotic features (HCC)   Current Medications:  Current Facility-Administered Medications  Medication Dose Route Frequency Provider Last Rate Last Dose  . acetaminophen (TYLENOL) tablet 650 mg  650 mg Oral Q6H PRN Nira Conn A, NP   650 mg at 04/25/19 1302  . alum & mag hydroxide-simeth (MAALOX/MYLANTA) 200-200-20 MG/5ML suspension 30 mL  30 mL Oral Q4H PRN Nira Conn A, NP      . hydrOXYzine (ATARAX/VISTARIL) tablet 25 mg  25 mg Oral TID PRN Jackelyn Poling, NP   25 mg at 04/24/19 2205  . LORazepam (ATIVAN) tablet 1 mg  1 mg Oral Q6H PRN Antonieta Pert, MD   1 mg at 04/25/19 2103  . OLANZapine zydis (ZYPREXA) disintegrating tablet 10 mg  10 mg Oral Q8H PRN Antonieta Pert, MD       And  . LORazepam (ATIVAN) tablet 1 mg  1 mg Oral PRN Antonieta Pert, MD       And  . ziprasidone (GEODON) injection 20 mg  20 mg Intramuscular PRN Antonieta Pert, MD      . magnesium hydroxide (MILK OF MAGNESIA) suspension 30 mL  30 mL Oral Daily PRN Nira Conn A, NP      . risperiDONE (RISPERDAL) tablet 1 mg  1 mg Oral QHS Nwoko, Agnes I, NP   1 mg at 04/25/19 2102  . sertraline (ZOLOFT) tablet 25 mg  25 mg Oral Daily Antonieta Pert, MD   25 mg at 04/26/19 0739  . traZODone (DESYREL) tablet 50 mg  50 mg Oral QHS PRN,MR X 1 Nira Conn A, NP       PTA Medications: Medications Prior to Admission  Medication Sig Dispense Refill Last Dose  . ibuprofen (ADVIL) 400 MG tablet Take 400 mg by mouth every 6 (six) hours as needed for headache or mild pain.     Marland Kitchen amoxicillin (AMOXIL) 500 MG capsule Take 1 capsule (500 mg total) by mouth 3 (three) times daily. (Patient not taking: Reported on  04/24/2019) 30 capsule 0 Not Taking at Unknown time  . diclofenac (VOLTAREN) 75 MG EC tablet Take 1 tablet (75 mg total) by mouth 2 (two) times daily. (Patient not taking: Reported on 04/24/2019) 12 tablet 0 Not Taking at Unknown time  . naproxen (NAPROSYN) 500 MG tablet Take 1 tablet (500 mg total) by mouth 2 (two) times daily. (Patient not taking: Reported on 04/24/2019) 10 tablet 0 Not Taking at Unknown time  . Pseudoephedrine-APAP-DM (DAYQUIL PO) Take by mouth.   Not Taking at Unknown time  . traMADol (ULTRAM) 50 MG tablet 1 or 2 po q6h prn pain (Patient not taking: Reported on 04/24/2019) 10 tablet 0 Not Taking at Unknown time    Patient Stressors:    Patient Strengths:    Treatment Modalities: Medication Management, Group therapy, Case management,  1 to 1 session with clinician, Psychoeducation, Recreational therapy.   Physician Treatment Plan for Primary Diagnosis: Severe recurrent major depression without psychotic features (HCC) Long Term Goal(s): Improvement in symptoms so as ready for discharge Improvement in symptoms so as ready for discharge   Short Term Goals: Ability to identify changes in lifestyle to reduce recurrence of condition will improve Ability to verbalize feelings will improve Ability to disclose and  discuss suicidal ideas Ability to demonstrate self-control will improve Ability to identify and develop effective coping behaviors will improve Ability to maintain clinical measurements within normal limits will improve Ability to identify triggers associated with substance abuse/mental health issues will improve Ability to identify changes in lifestyle to reduce recurrence of condition will improve Ability to verbalize feelings will improve Ability to disclose and discuss suicidal ideas Ability to demonstrate self-control will improve Ability to identify and develop effective coping behaviors will improve Ability to maintain clinical measurements within normal  limits will improve Ability to identify triggers associated with substance abuse/mental health issues will improve  Medication Management: Evaluate patient's response, side effects, and tolerance of medication regimen.  Therapeutic Interventions: 1 to 1 sessions, Unit Group sessions and Medication administration.  Evaluation of Outcomes: Progressing  Physician Treatment Plan for Secondary Diagnosis: Principal Problem:   Severe recurrent major depression without psychotic features ([place])  Long Term Goal(s): Improvement in symptoms so as ready for discharge Improvement in symptoms so as ready for discharge   Short Term Goals: Ability to identify changes in lifestyle to reduce recurrence of condition will improve Ability to verbalize feelings will improve Ability to disclose and discuss suicidal ideas Ability to demonstrate self-control will improve Ability to identify and develop effective coping behaviors will improve Ability to maintain clinical measurements within normal limits will improve Ability to identify triggers associated with substance abuse/mental health issues will improve Ability to identify changes in lifestyle to reduce recurrence of condition will improve Ability to verbalize feelings will improve Ability to disclose and discuss suicidal ideas Ability to demonstrate self-control will improve Ability to identify and develop effective coping behaviors will improve Ability to maintain clinical measurements within normal limits will improve Ability to identify triggers associated with substance abuse/mental health issues will improve     Medication Management: Evaluate patient's response, side effects, and tolerance of medication regimen.  Therapeutic Interventions: 1 to 1 sessions, Unit Group sessions and Medication administration.  Evaluation of Outcomes: Progressing   RN Treatment Plan for Primary Diagnosis: Severe recurrent major depression without psychotic  features ([place]) Long Term Goal(s): Knowledge of disease and therapeutic regimen to maintain health will improve  Short Term Goals: Ability to verbalize feelings will improve and Ability to identify and develop effective coping behaviors will improve  Medication Management: RN will administer medications as ordered by provider, will assess and evaluate patient's response and provide education to patient for prescribed medication. RN will report any adverse and/or side effects to prescribing provider.  Therapeutic Interventions: 1 on 1 counseling sessions, Psychoeducation, Medication administration, Evaluate responses to treatment, Monitor vital signs and CBGs as ordered, Perform/monitor CIWA, COWS, AIMS and Fall Risk screenings as ordered, Perform wound care treatments as ordered.  Evaluation of Outcomes: Progressing   LCSW Treatment Plan for Primary Diagnosis: Severe recurrent major depression without psychotic features ([place]) Long Term Goal(s): Safe transition to appropriate next level of care at discharge, Engage patient in therapeutic group addressing interpersonal concerns.  Short Term Goals: Engage patient in aftercare planning with referrals and resources, Increase social support, Increase emotional regulation, Identify triggers associated with mental health/substance abuse issues and Increase skills for wellness and recovery  Therapeutic Interventions: Assess for all discharge needs, 1 to 1 time with Social worker, Explore available resources and support systems, Assess for adequacy in community support network, Educate family and significant other(s) on suicide prevention, Complete Psychosocial Assessment, Interpersonal group therapy.  Evaluation of Outcomes: Progressing   Progress in Treatment: Attending groups: Yes.  Participating in groups: Yes. Taking medication as prescribed: Yes. Toleration medication: Yes. Family/Significant other contact made: No, will contact:  mother of his  child, [place] Patient understands diagnosis: Yes. Discussing patient identified problems/goals with staff: Yes. Medical problems stabilized or resolved: Yes. Denies suicidal/homicidal ideation: Yes. Issues/concerns per patient self-inventory: Yes.  New problem(s) identified: Yes, Describe:  limited supports, legal issues.  New Short Term/Long Term Goal(s): detox, medication management for mood stabilization; elimination of SI thoughts; development of comprehensive mental wellness/sobriety plan.  Patient Goals:    Discharge Plan or Barriers: Patient is new to unit, CSW assessing for appropriate referrals.   Reason for Continuation of Hospitalization: Anxiety Depression Medication stabilization Withdrawal symptoms  Estimated Length of Stay: 3-5 days  Attendees: Patient: 04/26/2019 9:09 AM  Physician: Javier [email or web address] 04/26/2019 9:09 AM  Nursing: Meriam SpragueBeverly, RN 04/26/2019 9:09 AM  RN Care Manager: 04/26/2019 9:09 AM  Social Worker: Enid Cutterharlotte Tahja Liao, LCSWA 04/26/2019 9:09 AM  Recreational Therapist:  04/26/2019 9:09 AM  Other:  04/26/2019 9:09 AM  Other:  04/26/2019 9:09 AM  Other: 04/26/2019 9:09 AM    Scribe for Treatment Team: Darreld Mcleanharlotte C Shatima Zalar, LCSWA 04/26/2019 9:09 AM

## 2019-04-26 NOTE — Progress Notes (Signed)
D.  Pt pleasant on approach, complaint of being unable to stay asleep voiced.  Pt was positive for evening wrap up group, observed engaged positively with peers on the unit.  Pt denies SI/HI/AVH at this time.  A. Support and encouragement offered, medication given as ordered.  Spoke with NP and new order for repeat of Trazodone dose received.  R.  Pt remains safe on the unit, will continue to monitor.

## 2019-04-26 NOTE — Progress Notes (Addendum)
[facility]  04/26/2019 10:57 AM Peter Pacheco  MRN:  [id number] Subjective:  "I'm ok."  Mr. Peter Pacheco found lying in bed. He reports stable mood but complains of nightmares and difficulty sleeping last night. 6.75 hours of sleep recorded. Risperdal was started yesterday due to continued insomnia with trazodone. He denies history of manic or psychotic symptoms or bipolar diagnosis. We discussed changing Risperdal to Remeron for insomnia and depression, and patient was agreeable. He denies prior psychotropic medication trials before hospitalization. He admits to feeling depressed with intermittent suicidal thoughts over the last several months. He is vague when identifying stressors. He states mood is improving over the course of hospitalization and denies current SI. He states he has been reflecting on his life and realizes he is lucky to have a second chance, particularly to be a better father for his 4957-month-old son. His fiance visited him, and they have resolved their conflict. He reports that he will be returning home with the fiance. He reports a headache and cold sweats over the last day but continues to deny recent pattern of benzodiazepine use prior to overdose. He denies HI/AVH. He has been calm and cooperative on the unit, interacting appropriately with peers and staff.  From admission H&P: Patient is a 23 year old male with a possible past psychiatric history significant for polysubstance use disorders, possible posttraumatic stress disorder, and recent intentional overdose of Xanax. The patient stated and the notes in the chart confirmed that the patient got into an argument with his girlfriend/fianc/mother of his child.Patient stated that "a friend" had given him some Xanax recently, and he took those pills. It was approximately 8 mg of Xanax.  Principal Problem: Severe recurrent major depression without psychotic features (HCC) Diagnosis: Principal Problem:   Severe  recurrent major depression without psychotic features (HCC)  Total Time spent with patient: 15 minutes  Past Psychiatric History: See admission H&P  Past Medical History:  Past Medical History:  Diagnosis Date  . Anxiety   . Depression   . Medical history non-contributory    History reviewed. No pertinent surgical history. Family History: History reviewed. No pertinent family history. Family Psychiatric  History: See admission H&P Social History:  Social History   Substance and Sexual Activity  Alcohol Use Yes   Comment: every other weekend     Social History   Substance and Sexual Activity  Drug Use Yes  . Types: Marijuana, Benzodiazepines    Social History   Socioeconomic History  . Marital status: Significant Other    Spouse name: Not on file  . Number of children: 1  . Years of education: Not on file  . Highest education level: Not on file  Occupational History  . Not on file  Social Needs  . Financial resource strain: Not on file  . Food insecurity    Worry: Not on file    Inability: Not on file  . Transportation needs    Medical: Not on file    Non-medical: Not on file  Tobacco Use  . Smoking status: Never Smoker  . Smokeless tobacco: Never Used  Substance and Sexual Activity  . Alcohol use: Yes    Comment: every other weekend  . Drug use: Yes    Types: Marijuana, Benzodiazepines  . Sexual activity: Yes  Lifestyle  . Physical activity    Days per week: Not on file    Minutes per session: Not on file  . Stress: Not on file  Relationships  . Social  connections    Talks on phone: Not on file    Gets together: Not on file    Attends religious service: Not on file    Active member of club or organization: Not on file    Attends meetings of clubs or organizations: Not on file    Relationship status: Not on file  Other Topics Concern  . Not on file  Social History Narrative  . Not on file   Additional Social History:                          Sleep: Fair  Appetite:  Good  Current Medications: Current Facility-Administered Medications  Medication Dose Route Frequency Provider Last Rate Last Dose  . acetaminophen (TYLENOL) tablet 650 mg  650 mg Oral Q6H PRN Nira Conn A, NP   650 mg at 04/25/19 1302  . alum & mag hydroxide-simeth (MAALOX/MYLANTA) 200-200-20 MG/5ML suspension 30 mL  30 mL Oral Q4H PRN Nira Conn A, NP      . hydrOXYzine (ATARAX/VISTARIL) tablet 25 mg  25 mg Oral TID PRN Jackelyn Poling, NP   25 mg at 04/24/19 2205  . LORazepam (ATIVAN) tablet 1 mg  1 mg Oral Q6H PRN Antonieta Pert, MD   1 mg at 04/25/19 2103  . OLANZapine zydis (ZYPREXA) disintegrating tablet 10 mg  10 mg Oral Q8H PRN Antonieta Pert, MD       And  . LORazepam (ATIVAN) tablet 1 mg  1 mg Oral PRN Antonieta Pert, MD       And  . ziprasidone (GEODON) injection 20 mg  20 mg Intramuscular PRN Antonieta Pert, MD      . magnesium hydroxide (MILK OF MAGNESIA) suspension 30 mL  30 mL Oral Daily PRN Nira Conn A, NP      . sertraline (ZOLOFT) tablet 25 mg  25 mg Oral Daily Antonieta Pert, MD   25 mg at 04/26/19 0739  . traZODone (DESYREL) tablet 50 mg  50 mg Oral QHS PRN,MR X 1 Nira Conn A, NP        Lab Results: No results found for this or any previous visit (from the past 48 hour(s)).  Blood Alcohol level:  Lab Results  Component Value Date   ETH <10 04/23/2019    Metabolic Disorder Labs: No results found for: HGBA1C, MPG No results found for: PROLACTIN No results found for: CHOL, TRIG, HDL, CHOLHDL, VLDL, LDLCALC  Physical Findings: AIMS: Facial and Oral Movements Muscles of Facial Expression: None, normal Lips and Perioral Area: None, normal Jaw: None, normal Tongue: None, normal,Extremity Movements Upper (arms, wrists, hands, fingers): None, normal Lower (legs, knees, ankles, toes): None, normal, Trunk Movements Neck, shoulders, hips: None, normal, Overall Severity Severity of abnormal movements  (highest score from questions above): None, normal Incapacitation due to abnormal movements: None, normal Patient's awareness of abnormal movements (rate only patient's report): No Awareness, Dental Status Current problems with teeth and/or dentures?: No Does patient usually wear dentures?: No  CIWA:  CIWA-Ar Total: 8 COWS:     Musculoskeletal: Strength & Muscle Tone: within normal limits Gait & Station: normal Patient leans: N/A  Psychiatric Specialty Exam: Physical Exam  Nursing note and vitals reviewed. Constitutional: He is oriented to person, place, and time. He appears well-developed and well-nourished.  Cardiovascular: Normal rate.  Respiratory: Effort normal.  Neurological: He is alert and oriented to person, place, and time.    Review of  Systems  Constitutional: Positive for diaphoresis. Negative for chills and fever.  Respiratory: Negative for cough and shortness of breath.   Cardiovascular: Negative for chest pain.  Gastrointestinal: Negative for abdominal pain, diarrhea, nausea and vomiting.  Neurological: Positive for headaches. Negative for tremors and sensory change.  Psychiatric/Behavioral: Positive for depression and substance abuse. Negative for hallucinations and suicidal ideas. The patient has insomnia. The patient is not nervous/anxious.     Blood pressure (!) 141/81, pulse 77, temperature 98.9 F (37.2 C), temperature source Oral, resp. rate 18, height 5\' 10"  (1.778 m), weight 102 kg.Body mass index is 32.27 kg/m.  General Appearance: Casual  Eye Contact:  Good  Speech:  Normal Rate  Volume:  Normal  Mood:  Euthymic  Affect:  Constricted  Thought Process:  Coherent  Orientation:  Full (Time, Place, and Person)  Thought Content:  Logical  Suicidal Thoughts:  No  Homicidal Thoughts:  No  Memory:  Immediate;   Fair Recent;   Fair  Judgement:  Fair  Insight:  Fair  Psychomotor Activity:  Normal  Concentration:  Concentration: Good and Attention Span:  Good  Recall:  [employer] of Knowledge:  Fair  Language:  Good  Akathisia:  No  Handed:  Right  AIMS (if indicated):     Assets:  Communication Skills Desire for Improvement Housing Resilience Social Support  ADL's:  Intact  Cognition:  WNL  Sleep:  Number of Hours: 6.75     Treatment Plan Summary: Daily contact with patient to assess and evaluate symptoms and progress in treatment and Medication management   Continue inpatient hospitalization.  Discontinue Risperdal Start Remeron 7.5 mg PO QHS for mood/sleep Continue Zoloft 25 mg PO daily for mood Continue Ativan 1 mg PO Q6HR PRN CIWA>10 for BZD withdrawal Continue Vistaril 25 mg PO TID PRN anxiety Continue trazodone 50 mg PO QHS PRN insomnia  Patient will participate in the therapeutic group milieu.  Discharge disposition in progress.   Connye Burkitt, NP 04/26/2019, 10:57 AM   Agree with NP progress note

## 2019-04-26 NOTE — Progress Notes (Addendum)
Spiritual care group on grief and loss facilitated by chaplain Jerene Pitch MDiv, [facility]  Group Goal:  Support / Education around grief and loss Members engage in facilitated group support and psycho-social education.  Group Description:  Following introductions and group rules, group members engaged in facilitated group dialog and support around topic of loss, with particular support around experiences of loss in their lives. Group Identified types of loss (relationships / self / things) and identified patterns, circumstances, and changes that precipitate losses. Reflected on thoughts / feelings around loss, normalized grief responses, and recognized variety in grief experience.   Group noted Worden's four tasks of grief in discussion.  Group drew on Adlerian / Rogerian, narrative, MI, Patient Progress: Peter Pacheco was present throughout group.  Engaged with group around topic of finding a new normal and "feeling as though I threw away a lot of good work because of one day."  Group provided support for Peter Pacheco to reflect on what keeps him going.  He reflected on his connections at [facility] and expressed gratefulness for support.

## 2019-04-26 NOTE — [facility] Group Notes (Signed)
LCSW Group Therapy Note 04/26/2019 3:03 PM  Type of Therapy and Topic: Group Therapy: Overcoming Obstacles  Participation Level: Active  Description of Group:  In this group patients will be encouraged to explore what they see as obstacles to their own wellness and recovery. They will be guided to discuss their thoughts, feelings, and behaviors related to these obstacles. The group will process together ways to cope with barriers, with attention given to specific choices patients can make. Each patient will be challenged to identify changes they are motivated to make in order to overcome their obstacles. This group will be process-oriented, with patients participating in exploration of their own experiences as well as giving and receiving support and challenge from other group members.  Therapeutic Goals: 1. Patient will identify personal and current obstacles as they relate to admission. 2. Patient will identify barriers that currently interfere with their wellness or overcoming obstacles.  3. Patient will identify feelings, thought process and behaviors related to these barriers. 4. Patient will identify two changes they are willing to make to overcome these obstacles:   Summary of Patient Progress  Tarl was engaged and participated throughout the group session. Haidan states that his main obstacle is he "I need to stop focusing on my past and learn how to look forward to the future. " Dontai states that he wants to work on building a new future while in the hospital.    Therapeutic Modalities:  Cognitive Behavioral Therapy Solution Focused Therapy Motivational Interviewing Relapse Prevention Therapy   [place] Social Worker

## 2019-04-26 NOTE — Progress Notes (Signed)
Recreation Therapy Notes  Date:  11.23.20 Time: 0930 Location: [address] Dayroom  Group Topic: Stress Management  Goal Area(s) Addresses:  Patient will identify positive stress management techniques. Patient will identify benefits of using stress management post d/c.  Behavioral Response: Engaged  Intervention: Stress Management  Activity :  Meditation.  LRT introduced the stress management technique of mountain meditation.  LRT played a meditation that focused on taking in the characteristics of a mountain.  Patients were to listen and follow along as meditation played to fully participate in activity.  Education:  Stress Management, Discharge Planning.   Education Outcome: Acknowledges Education  Clinical Observations/Feedback: Pt attended and participated in activity.    Victorino Sparrow, LRT/CTRS    Victorino Sparrow A 04/26/2019 11:21 AM

## 2019-04-27 DIAGNOSIS — F332 Major depressive disorder, recurrent severe without psychotic features: Principal | ICD-10-CM

## 2019-04-27 MED ORDER — MIRTAZAPINE 7.5 MG PO TABS: 7.5000 mg | ORAL_TABLET | Freq: Every day | ORAL | 0 refills | Status: DC

## 2019-04-27 MED ORDER — SERTRALINE HCL 50 MG PO TABS: 50.0000 mg | ORAL_TABLET | Freq: Every day | ORAL | 0 refills | Status: DC

## 2019-04-27 MED ORDER — SERTRALINE HCL 50 MG PO TABS
50.0000 mg | ORAL_TABLET | Freq: Every day | ORAL | Status: DC
  Filled 2019-04-27 (×3): qty 1

## 2019-04-27 NOTE — [facility] Group Notes (Signed)
Adult Psychoeducational Group Note  Date:  04/27/2019 Time:  1:48 AM  Group Topic/Focus:  Wrap-Up Group:   The focus of this group is to help patients review their daily goal of treatment and discuss progress on daily workbooks.  Participation Level:  Active  Participation Quality:  Appropriate  Affect:  Appropriate  Cognitive:  Appropriate  Insight: Appropriate  Engagement in Group:  Engaged  Modes of Intervention:  Discussion  Additional Comments:  Pt stated his goal was to focus on the future and not the past.  Pt stated this goal is on-going.  Pt rated the day at a 8/10.  Serenity Fortner 04/27/2019, 1:48 AM

## 2019-04-27 NOTE — [facility] Suicide Risk Assessment (Signed)
[place] INPATIENT:  Family/Significant Other Suicide Prevention Education  Suicide Prevention Education:  Contact Attempts: with girlfriend, Felipa Evener [phone]) has been identified by the patient as the family member/significant other with whom the patient will be residing, and identified as the person(s) who will aid the patient in the event of a mental health crisis.  With written consent from the patient, two attempts were made to provide suicide prevention education, prior to and/or following the patient's discharge.  We were unsuccessful in providing suicide prevention education.  A suicide education pamphlet was given to the patient to share with family/significant other.  Date and time of first attempt:04/27/2019 / 10:32am   [name] 04/27/2019, 10:32 AM

## 2019-04-27 NOTE — Progress Notes (Signed)
D:  Patient's self inventory sheet, patient has fair sleep, sleep medication helpful.  Good appetite, normal to high energy level, good concentration.  Denied depression, anxiety and hopeless.  Denied withdrawals.  Denied SI.  Denied physical problems.  Denied physical pain.  Goal is discharge and get life right.  Plans to discharge.  Does have discharge plans. A:  Medications administered per MD orders.  Emotional support and encouragement given patient. R:  Denied SI and HI, contracts for safety.  Denied A/V hallucinations.  Safety maintained with 15 minute checks.
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
## 2019-04-27 NOTE — Progress Notes (Signed)
   04/27/19 0145  Psych Admission Type (Psych Patients Only)  Admission Status Involuntary  Psychosocial Assessment  Patient Complaints None  Eye Contact Brief  Facial Expression Anxious  Affect Appropriate to circumstance  Speech Logical/coherent  Interaction Assertive  Motor Activity Other (Comment) (WDL)  Appearance/Hygiene Disheveled  Behavior Characteristics Cooperative  Mood Pleasant;Anxious  Thought Process  Coherency WDL  Content WDL  Delusions None reported or observed  Perception WDL  Hallucination None reported or observed  Judgment Impaired  Confusion None  Danger to Self  Current suicidal ideation? Denies  Danger to Others  Danger to Others None reported or observed  D: Patient in dayroom reports she had a good day. Pt reports he is ready for discharge to spend time with family. A: Medications administered as prescribed. Support and encouragement provided as needed.  R: Patient remains safe on the unit. Will continue to monitor for safety and stability.

## 2019-04-27 NOTE — BHH Suicide Risk Assessment (Signed)
[place] INPATIENT:  Family/Significant Other Suicide Prevention Education  Suicide Prevention Education:  Education Completed; with girlfriend, Peter Pacheco [phone]) has been identified by the patient as the family member/significant other with whom the patient will be residing, and identified as the person(s) who will aid the patient in the event of a mental health crisis (suicidal ideations/suicide attempt).  With written consent from the patient, the family member/significant other has been provided the following suicide prevention education, prior to the and/or following the discharge of the patient.  The suicide prevention education provided includes the following:  Suicide risk factors  Suicide prevention and interventions  National Suicide Hotline telephone number  [facility] assessment telephone number  [facility] Emergency Assistance [place] and/or Residential Mobile Crisis Unit telephone number  Request made of family/significant other to:  Remove weapons (e.g., guns, rifles, knives), all items previously/currently identified as safety concern.    Remove drugs/medications (over-the-counter, prescriptions, illicit drugs), all items previously/currently identified as a safety concern.  The family member/significant other verbalizes understanding of the suicide prevention education information provided.  The family member/significant other agrees to remove the items of safety concern listed above..  Patient's girlfriend reports that she does not have any concerns or questions regarding the patient discharging home today. She reports that she would like fot the MD and CSW to "stress" for the patient to remain compliant with his medications once he returns home. She states that she will pick the patient up for discharge.   CSW will continue to follow.     Peter Pacheco 04/27/2019, 10:38 AM

## 2019-04-27 NOTE — Progress Notes (Signed)
  [facility] Adult Case Management Discharge Plan :  Will you be returning to the same living situation after discharge:  Yes,  patient is returning home with his girlfriend At discharge, do you have transportation home?: Yes,  patient's girlfriend is picking him up Do you have the ability to pay for your medications: Yes,  Medicaid  Release of information consent forms completed and in the chart;  Patient's signature needed at discharge.  Patient to Follow up at: Follow-up Information    [facility], [facility]. Go on 04/28/2019.   Why: Hospital follow up appointment for medication management and therapy services, Wednesday, 04/28/19 at 11:00am. Please call the number listed if you have any additional questions or concerns regarding your appointment.  Contact information: [place] [address] [phone]           Next level of care provider has access to [place] and Suicide Prevention discussed: Yes,  with the patient's girlfriend  Have you used any form of tobacco in the last 30 days? (Cigarettes, Smokeless Tobacco, Cigars, and/or Pipes): No  Has patient been referred to the Quitline?: N/A patient is not a smoker  Patient has been referred for addiction treatment: Pt. refused referral  [name], [place] 04/27/2019, 10:53 AM

## 2019-04-27 NOTE — B[facility] Suicide Risk Assessment (Addendum)
[facility] Discharge Suicide Risk Assessment   Principal Problem: Severe recurrent major depression without psychotic features [facility]) Discharge Diagnoses: Principal Problem:   Severe recurrent major depression without psychotic features ([place])   Total Time spent with patient: 30 minutes  Musculoskeletal: Strength & Muscle Tone: within normal limits Gait & Station: normal Patient leans: N/A  Psychiatric Specialty Exam: ROS denies headache, no chest pain, no shortness of breath, no nausea, no vomiting  Blood pressure 126/79, pulse 70, temperature (!) 97.4 F (36.3 C), temperature source Oral, resp. rate 18, height 5\' 10"  (1.778 m), weight 102 kg.Body mass index is 32.27 kg/m.  General Appearance: Casual  Eye Contact::  Good  Speech:  Normal Rate409  Volume:  Normal  Mood:  Reports improving mood, states he feels better  Affect:  Appropriate and More reactive  Thought Process:  Linear and Descriptions of Associations: Intact  Orientation:  Full (Time, Place, and Person)  Thought Content:  No hallucinations, no delusions  Suicidal Thoughts:  No denies suicidal or self-injurious ideations, denies homicidal or violent ideations  Homicidal Thoughts:  No  Memory:  Recent and remote grossly intact  Judgement:  Other:  Improving  Insight:  Improving  Psychomotor Activity:  Normal no psychomotor agitation or restlessness  Concentration:  Good  Recall:  Good  Fund of Knowledge:Good  Language: Good  Akathisia:  Negative  Handed:  Right  AIMS (if indicated):     Assets:  Desire for Improvement Resilience  Sleep:  Number of Hours: 6.75  Cognition: WNL  ADL's:  Intact   Mental Status Per Nursing Assessment::   On Admission:  Self-harm behaviors, Suicidal ideation indicated by patient, Self-harm thoughts  Demographic Factors:  23 year old male  Loss Factors: Relationship stressors/argument  Historical Factors: History of depression, history of cannabis use disorder, no prior  psychiatric admissions  Risk Reduction Factors:   Sense of responsibility to family, Living with another person, especially a relative and Positive coping skills or problem solving skills  Continued Clinical Symptoms:  Today patient is alert, attentive, calm, polite on approach.  Reports mood as improved and currently minimizes/denies depression.  Affect is slightly constricted at first but it tends to improve during today's session.  Smiles at times appropriately.  No thought disorder.  Denies suicidal or self-injurious ideations.  Denies homicidal or violent ideations.  No hallucinations.  No delusions, not internally preoccupied, future oriented. Thus far tolerating Remeron and Zoloft well.  Denies side effects.  We have reviewed side effect profile, to include potential risk for sedation/weight gain on Remeron, precautions regarding not driving if feeling sedated, possible sexual side effects related to Zoloft, potentially increased risk of self-injurious/suicidal ideations early in treatment with antidepressants in young adults. Of note, routine EKG identified WPW pattern.  Patient denies any cardiac history, does not endorse episodes of tachycardia, palpitations or dyspnea. ( EKG reviewed with Cardiology Consultant. As per Cardiology no issues with current psychiatric medications )  With his express consent  I have spoken to his girlfriend via phone.  She corroborates that patient has improved significantly and is in agreement with discharge today  Cognitive Features That Contribute To Risk:  No gross cognitive deficits noted upon discharge. Is alert , attentive, and oriented x 3   Suicide Risk:  Mild:  Suicidal ideation of limited frequency, intensity, duration, and specificity.  There are no identifiable plans, no associated intent, mild dysphoria and related symptoms, good self-control (both objective and subjective assessment), few other risk factors, and identifiable protective factors,  including available and accessible social support.  Follow-up Information    [facility], [facility]. Go on 04/28/2019.   Why: Hospital follow up appointment for medication management and therapy services, Wednesday, 04/28/19 at 11:00am. Please call the number listed if you have any additional questions or concerns regarding your appointment.  Contact information: [address] [place] [address] [phone]           Plan Of Care/Follow-up recommendations:  Activity:  as tolerated Diet:  regular Tests:  NA Other:  See below  Patient is expressing readiness for discharge.  No current grounds for involuntary commitment.  Leaving unit in good spirits.  Plans to return home. Follow-up as above. 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  As noted patient has no cardiac symptoms at discharge, pulse rate 70. ( EKG reviewed with Cardiology Consultant. As per Cardiology no issues with current psychiatric medications .   Craige Cotta, MD 04/27/2019, 12:36 PM

## 2019-04-28 MED ORDER — SERTRALINE HCL 50 MG PO TABS: 50.0000 mg | ORAL_TABLET | Freq: Every day | ORAL | 0 refills | Status: AC

## 2019-04-28 MED ORDER — HYDROXYZINE HCL 25 MG PO TABS: 25.0000 mg | ORAL_TABLET | Freq: Three times a day (TID) | ORAL | 0 refills | Status: AC | PRN

## 2019-04-28 MED ORDER — MIRTAZAPINE 7.5 MG PO TABS: 7.5000 mg | ORAL_TABLET | Freq: Every day | ORAL | 0 refills | Status: AC

## 2019-04-28 NOTE — Progress Notes (Signed)
Adult Psychoeducational Group Note  Date:  04/28/2019 Time:  12:39 PM  Group Topic/Focus:  Identifying Needs:   The focus of this group is to help patients identify their personal needs that have been historically problematic and identify healthy behaviors to address their needs.  Participation Level:  Active  Participation Quality:  Appropriate  Affect:  Appropriate  Cognitive:  Alert  Insight: Appropriate  Engagement in Group:  Engaged  Modes of Intervention:  Discussion and Education  Additional Comments:    Pt participated in group with the MHT. Today's topic of the day is personal development. During group the MHT discussed Maslow's hierarchy of needs. As group staff and pts discussed the different levels of the hierarchy and if they have or have not met the requirements to advance to to the next stage. Pt's discussed issues they have at each level. As a group it was discussed how to overcome issues to attempt to reach the top goal of self actualization.   Lita Mains 04/28/2019, 12:39 PM

## 2019-07-01 ENCOUNTER — Other Ambulatory Visit: Payer: Self-pay

## 2019-07-01 ENCOUNTER — Ambulatory Visit
Admission: EM | Admit: 2019-07-01 | Discharge: 2019-07-01 | Disposition: A | Discharge status: Home / Self Care | Payer: Medicaid Other | Attending: Emergency Medicine | Admitting: Emergency Medicine

## 2019-07-01 DIAGNOSIS — Z20822 Contact with and (suspected) exposure to covid-19: Secondary | ICD-10-CM | POA: Diagnosis not present

## 2019-07-01 MED ORDER — CETIRIZINE HCL 10 MG PO TABS: 10.0000 mg | ORAL_TABLET | Freq: Every day | ORAL | 0 refills | Status: AC

## 2019-07-01 MED ORDER — FLUTICASONE PROPIONATE 50 MCG/ACT NA SUSP: 2.0000 | Freq: Every day | NASAL | 0 refills | Status: AC

## 2019-07-01 NOTE — ED Provider Notes (Signed)
[facility]   [id number] 07/01/19 Arrival Time: 1911   CC: COVID symptoms  SUBJECTIVE: History from: patient.  Peter Pacheco is a 24 y.o. male who presents with headache and sore throat x 2-3 days.  Admits to COVID exposure to family.  Has tried NOT tried OTC medications.  Denies previous COVID infection.   Denies fever, chills, fatigue, nasal congestion, rhinorrhea, SOB, wheezing, chest pain, nausea, vomiting, changes in bowel or bladder habits.    ROS: As per HPI.  All other pertinent ROS negative.     Past Medical History:  Diagnosis Date  . Anxiety   . Depression   . Medical history non-contributory    History reviewed. No pertinent surgical history. No Known Allergies No current facility-administered medications on file prior to encounter.   Current Outpatient Medications on File Prior to Encounter  Medication Sig Dispense Refill  . hydrOXYzine (ATARAX/VISTARIL) 25 MG tablet Take 1 tablet (25 mg total) by mouth 3 (three) times daily as needed for anxiety. 75 tablet 0  . mirtazapine (REMERON) 7.5 MG tablet Take 1 tablet (7.5 mg total) by mouth at bedtime. For sleep 30 tablet 0  . sertraline (ZOLOFT) 50 MG tablet Take 1 tablet (50 mg total) by mouth daily. For depression 30 tablet 0   Social History   Socioeconomic History  . Marital status: Significant Other    Spouse name: Not on file  . Number of children: 1  . Years of education: Not on file  . Highest education level: Not on file  Occupational History  . Not on file  Tobacco Use  . Smoking status: Never Smoker  . Smokeless tobacco: Never Used  Substance and Sexual Activity  . Alcohol use: Yes    Comment: every other weekend  . Drug use: Yes    Types: Marijuana, Benzodiazepines  . Sexual activity: Yes  Other Topics Concern  . Not on file  Social History Narrative  . Not on file   Social Determinants of Health   Financial Resource Strain:   . Difficulty of Paying Living Expenses: Not on  file  Food Insecurity:   . Worried About Programme researcher, broadcasting/film/video in the Last Year: Not on file  . Ran Out of Food in the Last Year: Not on file  Transportation Needs:   . Lack of Transportation (Medical): Not on file  . Lack of Transportation (Non-Medical): Not on file  Physical Activity:   . Days of Exercise per Week: Not on file  . Minutes of Exercise per Session: Not on file  Stress:   . Feeling of Stress : Not on file  Social Connections:   . Frequency of Communication with Friends and Family: Not on file  . Frequency of Social Gatherings with Friends and Family: Not on file  . Attends Religious Services: Not on file  . Active Member of Clubs or Organizations: Not on file  . Attends Banker Meetings: Not on file  . Marital Status: Not on file  Intimate Partner Violence:   . Fear of Current or Ex-Partner: Not on file  . Emotionally Abused: Not on file  . Physically Abused: Not on file  . Sexually Abused: Not on file   Family History  Problem Relation Age of Onset  . Healthy Mother   . Healthy Father     OBJECTIVE:  There were no vitals filed for this visit.   General appearance: alert; appears mildly fatigued, but nontoxic; speaking in full sentences and  tolerating own secretions HEENT: NCAT; Ears: EACs clear, TMs pearly gray; Eyes: PERRL.  EOM grossly intact. Nose: nares patent without rhinorrhea, Throat: oropharynx clear, tonsils non erythematous or enlarged, uvula midline  Neck: supple without LAD Lungs: unlabored respirations, symmetrical air entry; cough: absent; no respiratory distress; CTAB Heart: regular rate and rhythm.  Skin: warm and dry Psychological: alert and cooperative; normal mood and affect  ASSESSMENT & PLAN:  1. Suspected COVID-19 virus infection   2. Exposure to COVID-19 virus     Meds ordered this encounter  Medications  . cetirizine (ZYRTEC) 10 MG tablet    Sig: Take 1 tablet (10 mg total) by mouth daily.    Dispense:  30 tablet      Refill:  0    Order Specific Question:   Supervising Provider    Answer:   Raylene Everts [[id number]]  . fluticasone (FLONASE) 50 MCG/ACT nasal spray    Sig: Place 2 sprays into both nostrils daily.    Dispense:  16 g    Refill:  0    Order Specific Question:   Supervising Provider    Answer:   Raylene Everts [[id number]]   COVID testing ordered.  It will take between 5-7 days for test results.  Someone will contact you regarding abnormal results.    In the meantime: You should remain isolated in your home for 10 days from symptom onset AND greater than 72 hours after symptoms resolution (absence of fever without the use of fever-reducing medication and improvement in respiratory symptoms), whichever is longer Get plenty of rest and push fluids Use OTC zyrtec for nasal congestion, runny nose, and/or sore throat Use OTC flonase for nasal congestion and runny nose Use medications daily for symptom relief Use OTC medications like ibuprofen or tylenol as needed fever or pain Call or go to the ED if you have any new or worsening symptoms such as fever, cough, shortness of breath, chest tightness, chest pain, turning blue, changes in mental status, etc...   Reviewed expectations re: course of current medical issues. Questions answered. Outlined signs and symptoms indicating need for more acute intervention. Patient verbalized understanding. After Visit Summary given.         Lestine Box, PA-C 07/01/19 2012

## 2019-07-01 NOTE — Discharge Instructions (Signed)

## 2019-07-01 NOTE — ED Triage Notes (Signed)
Pt presents with headache and sore throat after posiitve covid exposure

## 2019-07-02 LAB — NOVEL CORONAVIRUS, NAA: SARS-CoV-2, NAA: NOT DETECTED

## 2021-05-06 ENCOUNTER — Other Ambulatory Visit: Payer: Self-pay

## 2021-05-06 ENCOUNTER — Encounter (HOSPITAL_COMMUNITY): Payer: Self-pay | Admitting: Emergency Medicine

## 2021-05-06 ENCOUNTER — Emergency Department (HOSPITAL_COMMUNITY)
Admission: EM | Admit: 2021-05-06 | Discharge: 2021-05-06 | Disposition: A | Discharge status: Home / Self Care | Payer: Medicaid Other | Attending: Emergency Medicine | Admitting: Emergency Medicine

## 2021-05-06 DIAGNOSIS — H66002 Acute suppurative otitis media without spontaneous rupture of ear drum, left ear: Secondary | ICD-10-CM | POA: Diagnosis not present

## 2021-05-06 DIAGNOSIS — H9202 Otalgia, left ear: Secondary | ICD-10-CM | POA: Diagnosis present

## 2021-05-06 MED ORDER — AMOXICILLIN 250 MG PO CAPS
500.0000 mg | ORAL_CAPSULE | Freq: Once | ORAL | Status: AC
  Administered 2021-05-06: 04:00:00 500 mg via ORAL
  Filled 2021-05-06: qty 2

## 2021-05-06 MED ORDER — HYDROCODONE-ACETAMINOPHEN 5-325 MG PO TABS
1.0000 | ORAL_TABLET | Freq: Once | ORAL | Status: AC
  Administered 2021-05-06: 04:00:00 1 via ORAL
  Filled 2021-05-06: qty 1

## 2021-05-06 MED ORDER — AMOXICILLIN 500 MG PO CAPS: 500.0000 mg | ORAL_CAPSULE | Freq: Three times a day (TID) | ORAL | 0 refills | Status: AC

## 2021-05-06 NOTE — ED Triage Notes (Addendum)
Pt arrives POV. C/o sinus infection prior month, left ear pain that started 2 hours ago. Pt has a cough that is thick yellow mucus with red tinges.  Denies fever, n/v. Pt has diarrhea. Pt had strep throat last week.   Pt took tylenol and mucinex 2 hours ago.

## 2021-05-06 NOTE — ED Provider Notes (Signed)
[facility] EMERGENCY DEPARTMENT Provider Note   CSN: [id number] Arrival date & time: 05/06/21  0135     History Chief Complaint  Patient presents with   Otalgia    Peter Pacheco is a 25 y.o. male.  The history is provided by the patient.  Otalgia Location:  Left Quality:  Aching Severity:  Moderate Onset quality:  Sudden Timing:  Constant Progression:  Unchanged Chronicity:  New Relieved by:  Nothing Worsened by:  Nothing Associated symptoms: congestion, cough and sore throat   Associated symptoms: no ear discharge   Patient reports cough and congestion for the past month.  Reports sore throat and assumed he had strep throat but has not been treated or tested Had onset of ear pain about 2 hours ago that is worsening    Past Medical History:  Diagnosis Date   Anxiety    Depression    Medical history non-contributory     Patient Active Problem List   Diagnosis Date Noted   Severe recurrent major depression without psychotic features (HCC) 04/24/2019    History reviewed. No pertinent surgical history.     Family History  Problem Relation Age of Onset   Healthy Mother    Healthy Father     Social History   Tobacco Use   Smoking status: Never   Smokeless tobacco: Never  Vaping Use   Vaping Use: Never used  Substance Use Topics   Alcohol use: Yes    Comment: every other weekend   Drug use: Yes    Frequency: 7.0 times per week    Types: Marijuana    Comment: 5-6 times a day    Home Medications Prior to Admission medications   Medication Sig Start Date End Date Taking? Authorizing Provider  amoxicillin (AMOXIL) 500 MG capsule Take 1 capsule (500 mg total) by mouth 3 (three) times daily. 05/06/21  Yes Zadie Rhine, MD  cetirizine (ZYRTEC) 10 MG tablet Take 1 tablet (10 mg total) by mouth daily. 07/01/19   Wurst, [place], PA-C  fluticasone (FLONASE) 50 MCG/ACT nasal spray Place 2 sprays into both nostrils daily. 07/01/19   Wurst, [place],  PA-C  hydrOXYzine (ATARAX/VISTARIL) 25 MG tablet Take 1 tablet (25 mg total) by mouth 3 (three) times daily as needed for anxiety. 04/28/19   Armandina Stammer I, NP  mirtazapine (REMERON) 7.5 MG tablet Take 1 tablet (7.5 mg total) by mouth at bedtime. For sleep 04/28/19   Armandina Stammer I, NP  sertraline (ZOLOFT) 50 MG tablet Take 1 tablet (50 mg total) by mouth daily. For depression 04/28/19   Sanjuana Kava, NP    Allergies    Patient has no known allergies.  Review of Systems   Review of Systems  HENT:  Positive for congestion, ear pain and sore throat. Negative for ear discharge.   Respiratory:  Positive for cough.    Physical Exam Updated Vital Signs BP 127/80   Pulse (!) 58   Temp 98.8 F (37.1 C)   Resp 20   Ht 1.803 m (5\' 11" )   Wt 107.5 kg   SpO2 94%   BMI 33.05 kg/m   Physical Exam CONSTITUTIONAL: Well developed/well nourished HEAD: Normocephalic/atraumatic EYES: EOMI/PERRL ENMT: Mucous membranes moist, right TM clear and intact.  Left TM erythematous and bulging.  No signs of TM rupture on the left.  Uvula is midline without erythema or exudates.  No drooling or stridor NECK: supple no meningeal signs SPINE/BACK:entire spine nontender CV: S1/S2 noted, no murmurs/rubs/gallops noted  LUNGS: Lungs are clear to auscultation bilaterally, no apparent distress ABDOMEN: soft NEURO: Pt is awake/alert/appropriate, moves all extremitiesx4.  No facial droop.   EXTREMITIES: full ROM SKIN: warm, color normal   ED Results / Procedures / Treatments   Labs (all labs ordered are listed, but only abnormal results are displayed) Labs Reviewed - No data to display  EKG None  Radiology No results found.  Procedures Procedures   Medications Ordered in ED Medications  amoxicillin (AMOXIL) capsule 500 mg (500 mg Oral Given 05/06/21 0413)  HYDROcodone-acetaminophen (NORCO/VICODIN) 5-325 MG per tablet 1 tablet (1 tablet Oral Given 05/06/21 0413)    ED Course  I have reviewed the  triage vital signs and the nursing notes.  Pertinent labs & imaging results that were available during my care of the patient were reviewed by me and considered in my medical decision making (see chart for details).    MDM Rules/Calculators/A&P                            Final Clinical Impression(s) / ED Diagnoses Final diagnoses:  Non-recurrent acute suppurative otitis media of left ear without spontaneous rupture of tympanic membrane    Rx / DC Orders ED Discharge Orders          Ordered    amoxicillin (AMOXIL) 500 MG capsule  3 times daily        05/06/21 0404             Zadie Rhine, MD 05/06/21 0500

## 2021-05-06 NOTE — ED Notes (Signed)
ED Provider at bedside. 

## 2021-05-07 ENCOUNTER — Telehealth: Payer: Self-pay

## 2021-05-07 NOTE — Telephone Encounter (Signed)
Transition Care Management Unsuccessful Follow-up Telephone Call  Date of discharge and from where:  05/06/2021-[facility]   Attempts:  1st Attempt  Reason for unsuccessful TCM follow-up call:  Left voice message

## 2021-05-08 NOTE — Telephone Encounter (Signed)
Transition Care Management Unsuccessful Follow-up Telephone Call  Date of discharge and from where:  05/06/2021-[place]   Attempts:  2nd Attempt  Reason for unsuccessful TCM follow-up call:  Left voice message

## 2021-05-09 NOTE — Telephone Encounter (Signed)
Transition Care Management Follow-up Telephone Call Date of discharge and from where: 05/06/2021 from [facility] How have you been since you were released from the hospital? Pt stated that his ear is feeling better. Pt stated that his hearing has worsened since starting the abx. Pt is establishing with a PCP but there are long waits to get in for an appt.  Any questions or concerns? No  Items Reviewed: Did the pt receive and understand the discharge instructions provided? Yes  Medications obtained and verified? Yes  Other? No  Any new allergies since your discharge? No  Dietary orders reviewed? No Do you have support at home? Yes   Functional Questionnaire: (I = Independent and D = Dependent) ADLs: I  Bathing/Dressing- I  Meal Prep- I  Eating- I  Maintaining continence- I  Transferring/Ambulation- I  Managing Meds- I   Follow up appointments reviewed:  PCP Hospital f/u appt confirmed? No   Specialist Hospital f/u appt confirmed? No   Are transportation arrangements needed? No  If their condition worsens, is the pt aware to call PCP or go to the Emergency Dept.? Yes Was the patient provided with contact information for the PCP's office or ED? Yes Was to pt encouraged to call back with questions or concerns? Yes

## 2021-06-18 ENCOUNTER — Ambulatory Visit
Admission: EM | Admit: 2021-06-18 | Discharge: 2021-06-18 | Disposition: A | Discharge status: Home / Self Care | Payer: Medicaid Other | Attending: Urgent Care | Admitting: Urgent Care

## 2021-06-18 ENCOUNTER — Other Ambulatory Visit: Payer: Self-pay

## 2021-06-18 ENCOUNTER — Encounter: Payer: Self-pay | Admitting: Emergency Medicine

## 2021-06-18 DIAGNOSIS — J3089 Other allergic rhinitis: Secondary | ICD-10-CM

## 2021-06-18 DIAGNOSIS — R053 Chronic cough: Secondary | ICD-10-CM | POA: Diagnosis not present

## 2021-06-18 DIAGNOSIS — H1032 Unspecified acute conjunctivitis, left eye: Secondary | ICD-10-CM | POA: Diagnosis not present

## 2021-06-18 DIAGNOSIS — F1210 Cannabis abuse, uncomplicated: Secondary | ICD-10-CM | POA: Diagnosis not present

## 2021-06-18 MED ORDER — BENZONATATE 100 MG PO CAPS: 100.0000 mg | ORAL_CAPSULE | Freq: Three times a day (TID) | ORAL | 0 refills | Status: AC | PRN

## 2021-06-18 MED ORDER — PREDNISONE 20 MG PO TABS: ORAL_TABLET | ORAL | 0 refills | Status: AC

## 2021-06-18 MED ORDER — LEVOCETIRIZINE DIHYDROCHLORIDE 5 MG PO TABS: 5.0000 mg | ORAL_TABLET | Freq: Every evening | ORAL | 1 refills | Status: AC

## 2021-06-18 MED ORDER — TOBRAMYCIN 0.3 % OP SOLN: 1.0000 [drp] | OPHTHALMIC | 0 refills | Status: AC

## 2021-06-18 MED ORDER — PROMETHAZINE-DM 6.25-15 MG/5ML PO SYRP: 5.0000 mL | ORAL_SOLUTION | Freq: Every evening | ORAL | 0 refills | Status: AC | PRN

## 2021-06-18 NOTE — ED Provider Notes (Signed)
[facility]   MRN: [id number] DOB: 1995/08/14  Subjective:   Peter Pacheco is a 26 y.o. male presenting for 1+ month history of persistent coughing, throat congestion and drainage worse in the morning.  Patient reports that during the day he feels fine but has been waking up every day with these kinds of symptoms.  He was seen in December and was treated for a sinus infection with amoxicillin.  He did complete the course and reports that this helped him with some ear pain he was having as well.  Would also like an evaluation for left eye redness, woke up with matted eyelids this morning.  No chest pain, shortness of breath or wheezing, sinus pain, facial pain, ear drainage.  Patient does not smoke cigarettes.  He does smoke marijuana 3-5 times daily.  No history of asthma.  Denies taking chronic medications.    No Known Allergies  Past Medical History:  Diagnosis Date   Anxiety    Depression    Medical history non-contributory      History reviewed. No pertinent surgical history.  Family History  Problem Relation Age of Onset   Healthy Mother    Healthy Father     Social History   Tobacco Use   Smoking status: Never   Smokeless tobacco: Never  Vaping Use   Vaping Use: Never used  Substance Use Topics   Alcohol use: Yes    Comment: every other weekend   Drug use: Yes    Frequency: 7.0 times per week    Types: Marijuana    Comment: 5-6 times a day    ROS   Objective:   Vitals: BP (!) 141/79 (BP Location: Right Arm)    Pulse 70    Temp 98.2 F (36.8 C) (Oral)    Resp 18    Ht 5\' 10"  (1.778 m)    Wt 237 lb (107.5 kg)    SpO2 96%    BMI 34.01 kg/m   Physical Exam Constitutional:      General: He is not in acute distress.    Appearance: Normal appearance. He is well-developed and normal weight. He is not ill-appearing, toxic-appearing or diaphoretic.  HENT:     Head: Normocephalic and atraumatic.     Right Ear: Tympanic membrane, ear canal  and external ear normal. There is no impacted cerumen.     Left Ear: Tympanic membrane, ear canal and external ear normal. There is no impacted cerumen.     Nose: Congestion and rhinorrhea present.     Mouth/Throat:     Mouth: Mucous membranes are moist.     Pharynx: No oropharyngeal exudate or posterior oropharyngeal erythema.  Eyes:     General: No scleral icterus.       Right eye: No discharge.        Left eye: Discharge present.    Extraocular Movements: Extraocular movements intact.  Cardiovascular:     Rate and Rhythm: Normal rate and regular rhythm.     Heart sounds: Normal heart sounds. No murmur heard.   No friction rub. No gallop.  Pulmonary:     Effort: Pulmonary effort is normal. No respiratory distress.     Breath sounds: Normal breath sounds. No stridor. No wheezing, rhonchi or rales.  Musculoskeletal:     Cervical back: Normal range of motion and neck supple. No rigidity. No muscular tenderness.  Neurological:     General: No focal deficit present.     Mental Status: He  is alert and oriented to person, place, and time.  Psychiatric:        Mood and Affect: Mood normal.        Behavior: Behavior normal.        Thought Content: Thought content normal.    Assessment and Plan :   PDMP not reviewed this encounter.  1. Allergic rhinitis due to other allergic trigger, unspecified seasonality   2. Marijuana abuse   3. Acute bacterial conjunctivitis of left eye   4. Persistent cough    Patient has a clear cardiopulmonary exam, therefore deferred imaging. Does not meet [facility] criteria for strep testing.  Suspect an allergic rhinitis secondary to his marijuana abuse and therefore recommending he start taking Xyzal daily.  For his current flare we will use an oral prednisone course.  Use supportive care otherwise.  We will address a bacterial conjunctivitis with tobramycin eyedrops. Counseled patient on potential for adverse effects with medications prescribed/recommended  today, ER and return-to-clinic precautions discussed, patient verbalized understanding.    Wallis Bamberg, PA-C 06/18/21 1348

## 2021-06-18 NOTE — ED Triage Notes (Signed)
Pt reports cough, chest congestion x1 month. Pt reports productive cough with yellow sputum. Denies known fevers. Pt denies any gi symptoms.  Pt also reports left eye irritation, redness, drainage x2 days.

## 2021-08-08 DIAGNOSIS — M25569 Pain in unspecified knee: Secondary | ICD-10-CM | POA: Diagnosis not present

## 2021-08-08 DIAGNOSIS — M25519 Pain in unspecified shoulder: Secondary | ICD-10-CM | POA: Diagnosis not present

## 2022-05-26 ENCOUNTER — Other Ambulatory Visit: Payer: Self-pay

## 2022-05-26 ENCOUNTER — Emergency Department (HOSPITAL_COMMUNITY)
Admission: EM | Admit: 2022-05-26 | Discharge: 2022-05-26 | Discharge status: Against Medical Advice | Payer: Medicaid Other | Attending: Emergency Medicine | Admitting: Emergency Medicine

## 2022-05-26 ENCOUNTER — Encounter (HOSPITAL_COMMUNITY): Payer: Self-pay

## 2022-05-26 DIAGNOSIS — B349 Viral infection, unspecified: Secondary | ICD-10-CM | POA: Diagnosis not present

## 2022-05-26 DIAGNOSIS — R109 Unspecified abdominal pain: Secondary | ICD-10-CM | POA: Insufficient documentation

## 2022-05-26 DIAGNOSIS — Z20822 Contact with and (suspected) exposure to covid-19: Secondary | ICD-10-CM | POA: Diagnosis not present

## 2022-05-26 DIAGNOSIS — J029 Acute pharyngitis, unspecified: Secondary | ICD-10-CM | POA: Diagnosis not present

## 2022-05-26 DIAGNOSIS — Z5321 Procedure and treatment not carried out due to patient leaving prior to being seen by health care provider: Secondary | ICD-10-CM | POA: Diagnosis not present

## 2022-05-26 DIAGNOSIS — R059 Cough, unspecified: Secondary | ICD-10-CM | POA: Diagnosis not present

## 2022-05-26 DIAGNOSIS — R1110 Vomiting, unspecified: Secondary | ICD-10-CM | POA: Diagnosis not present

## 2022-05-26 NOTE — ED Triage Notes (Signed)
Pt presents with emesis that started today. States that he has not been able to keep anything down. Pt states he was sick with a cough at the beginning of last week. States he only has abdominal pain because he is hungry.

## 2022-07-23 DIAGNOSIS — Z6834 Body mass index (BMI) 34.0-34.9, adult: Secondary | ICD-10-CM | POA: Diagnosis not present

## 2022-07-23 DIAGNOSIS — J069 Acute upper respiratory infection, unspecified: Secondary | ICD-10-CM | POA: Diagnosis not present

## 2022-07-23 DIAGNOSIS — R030 Elevated blood-pressure reading, without diagnosis of hypertension: Secondary | ICD-10-CM | POA: Diagnosis not present

## 2022-07-23 DIAGNOSIS — E669 Obesity, unspecified: Secondary | ICD-10-CM | POA: Diagnosis not present

## 2023-02-03 DIAGNOSIS — G44209 Tension-type headache, unspecified, not intractable: Secondary | ICD-10-CM | POA: Diagnosis not present

## 2023-12-12 DIAGNOSIS — M62838 Other muscle spasm: Secondary | ICD-10-CM | POA: Diagnosis not present

## 2023-12-12 DIAGNOSIS — S161XXA Strain of muscle, fascia and tendon at neck level, initial encounter: Secondary | ICD-10-CM | POA: Diagnosis not present

## 2023-12-12 DIAGNOSIS — S46812A Strain of other muscles, fascia and tendons at shoulder and upper arm level, left arm, initial encounter: Secondary | ICD-10-CM | POA: Diagnosis not present

## 2023-12-12 DIAGNOSIS — Z6830 Body mass index (BMI) 30.0-30.9, adult: Secondary | ICD-10-CM | POA: Diagnosis not present

## 2024-01-21 DIAGNOSIS — M25511 Pain in right shoulder: Secondary | ICD-10-CM | POA: Diagnosis not present

## 2024-01-21 DIAGNOSIS — Z6830 Body mass index (BMI) 30.0-30.9, adult: Secondary | ICD-10-CM | POA: Diagnosis not present

## 2024-01-21 DIAGNOSIS — S161XXA Strain of muscle, fascia and tendon at neck level, initial encounter: Secondary | ICD-10-CM | POA: Diagnosis not present

## 2024-01-21 DIAGNOSIS — M62838 Other muscle spasm: Secondary | ICD-10-CM | POA: Diagnosis not present

## 2024-01-21 DIAGNOSIS — S46812A Strain of other muscles, fascia and tendons at shoulder and upper arm level, left arm, initial encounter: Secondary | ICD-10-CM | POA: Diagnosis not present
